# Patient Record
Sex: Female | Born: 2000 | Race: Black or African American | Hispanic: No | Marital: Single | Smoking: Never smoker
Health system: Southern US, Community
[De-identification: ages and names within clinical notes are randomized; demographics above are authoritative.]

## PROBLEM LIST (undated history)

## (undated) ENCOUNTER — Inpatient Hospital Stay (HOSPITAL_COMMUNITY): Payer: Self-pay

## (undated) DIAGNOSIS — Z8759 Personal history of other complications of pregnancy, childbirth and the puerperium: Secondary | ICD-10-CM

## (undated) DIAGNOSIS — D649 Anemia, unspecified: Secondary | ICD-10-CM

## (undated) DIAGNOSIS — K219 Gastro-esophageal reflux disease without esophagitis: Secondary | ICD-10-CM

## (undated) DIAGNOSIS — Z789 Other specified health status: Secondary | ICD-10-CM

## (undated) HISTORY — PX: COLON SURGERY: SHX602

---
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
## 2018-01-10 ENCOUNTER — Telehealth: Payer: Self-pay | Admitting: General Practice

## 2018-01-10 NOTE — Telephone Encounter (Signed)
Spoke with patient in regards to switching FP medicaid to pregnancy Medicaid.

## 2018-01-16 ENCOUNTER — Ambulatory Visit (INDEPENDENT_AMBULATORY_CARE_PROVIDER_SITE_OTHER): Payer: Medicaid Other | Admitting: Obstetrics and Gynecology

## 2018-01-16 ENCOUNTER — Other Ambulatory Visit: Payer: Self-pay

## 2018-01-16 VITALS — BP 97/60 | HR 78 | Wt 96.8 lb

## 2018-01-16 DIAGNOSIS — Z3400 Encounter for supervision of normal first pregnancy, unspecified trimester: Secondary | ICD-10-CM

## 2018-01-16 NOTE — Progress Notes (Signed)
   PRENATAL VISIT NOTE  Subjective:  Victoria Frye is a 17 y.o. G2P0010 at 5659w4d being seen today for ongoing prenatal care.  She is currently monitored for the following issues for this low-risk pregnancy and has Retained products of conception after miscarriage and Supervision of normal first pregnancy, antepartum on their problem list.  Patient reports decreased appetite and "unable to finish food once she is eating".  Contractions: Not present. Vag. Bleeding: None.  Movement: Absent. Denies leaking of fluid.   The following portions of the patient's history were reviewed and updated as appropriate: allergies, current medications, past family history, past medical history, past social history, past surgical history and problem list. Problem list updated.  Objective:   Vitals:   01/16/18 0950  BP: 97/60  Pulse: 78  Weight: 96 lb 12.8 oz (43.9 kg)    Fetal Status: Fetal Heart Rate (bpm): 155 Fundal Height: 16 cm Movement: Absent     General:  Alert, oriented and cooperative. Patient is in no acute distress.  Skin: Skin is warm and dry. No rash noted.   Cardiovascular: Normal heart rate noted  Respiratory: Normal respiratory effort, no problems with respiration noted  Abdomen: Soft, gravid, appropriate for gestational age.  Pain/Pressure: Absent     Pelvic: Cervical exam deferred        Extremities: Normal range of motion.  Edema: None  Mental Status: Normal mood and affect. Normal behavior. Normal judgment and thought content.   Assessment and Plan:  Pregnancy: G2P0010 at 859w4d  1. Supervision of normal first pregnancy, antepartum  - Advised to eat small frequent meals/snacks every 2-3 hrs - Recommend buying OTC Premier Protein Shake for meal/snack supplement - Information provided on high protein diet and protein rich foods   Preterm labor symptoms and general obstetric precautions including but not limited to vaginal bleeding, contractions, leaking of fluid and fetal  movement were reviewed in detail with the patient. Please refer to After Visit Summary for other counseling recommendations.  Return in about 4 weeks (around 02/13/2018) for Return OB visit.  Future Appointments  Date Time Provider Department Center  01/29/2018 10:15 AM [facility] US 4 [facility]  02/13/2018  9:50 AM Raelyn Moraawson, Luay Balding, CNM [facility] None    Raelyn Moraolitta Austen Wygant, CNM

## 2018-01-16 NOTE — Patient Instructions (Signed)
Protein Content in Foods Generally, most healthy people need around 50 grams of protein each day. Depending on your overall health, you may need more or less protein in your diet. Talk to your health care provider or dietitian about how much protein you need. See the following list for the protein content of some common foods. High-protein foods High-protein foods contain 4 grams (4 g) or more of protein per serving. They include:  Beef, ground sirloin (cooked) - 3 oz have 24 g of protein.  Cheese (hard) - 1 oz has 7 g of protein.  Chicken breast, boneless and skinless (cooked) - 3 oz have 13.4 g of protein.  Cottage cheese - 1/2 cup has 13.4 g of protein.  Egg - 1 egg has 6 g of protein.  Fish, filet (cooked) - 1 oz has 6-7 g of protein.  Garbanzo beans (canned or cooked) - 1/2 cup has 6-7 g of protein.  Kidney beans (canned or cooked) - 1/2 cup has 6-7 g of protein.  Lamb (cooked) - 3 oz has 24 g of protein.  Milk - 1 cup (8 oz) has 8 g of protein.  Nuts (peanuts, pistachios, almonds) - 1 oz has 6 g of protein.  Peanut butter - 1 oz has 7-8 g of protein.  Pork tenderloin (cooked) - 3 oz has 18.4 g of protein.  Pumpkin seeds - 1 oz has 8.5 g of protein.  Soybeans (roasted) - 1 oz has 8 g of protein.  Soybeans (cooked) - 1/2 cup has 11 g of protein.  Soy milk - 1 cup (8 oz) has 5-10 g of protein.  Soy or vegetable patty - 1 patty has 11 g of protein.  Sunflower seeds - 1 oz has 5.5 g of protein.  Tofu (firm) - 1/2 cup has 20 g of protein.  Tuna (canned in water) - 3 oz has 20 g of protein.  Yogurt - 6 oz has 8 g of protein.  Low-protein foods Low-protein foods contain 3 grams (3 g) or less of protein per serving. They include:  Beets (raw or cooked) - 1/2 cup has 1.5 g of protein.  Bran cereal - 1/2 cup has 2-3 g of protein.  Bread - 1 slice has 2.5 g of protein.  Broccoli (raw or cooked) - 1/2 cup has 2 g of protein.  Collard greens (raw or cooked) - 1/2  cup has 2 g of protein.  Corn (fresh or cooked) - 1/2 cup has 2 g of protein.  Cream cheese - 1 oz has 2 g of protein.  Creamer (half-and-half) - 1 oz has 1 g of protein.  Flour tortilla - 1 tortilla has 2.5 g of protein  Frozen yogurt - 1/2 cup has 3 g of protein.  Fruit or vegetable juice - 1/2 cup has 1 g of protein.  Green beans (raw or cooked) - 1/2 cup has 1 g of protein.  Green peas (canned) - 1/2 cup has 3.5 g of protein.  Muffins - 1 small muffin (2 oz) has 3 g of protein.  Oatmeal (cooked) - 1/2 cup has 3 g of protein.  Potato (baked with skin) - 1 medium potato has 3 g of protein.  Rice (cooked) - 1/2 cup has 2.5-3.5 g of protein.  Sour cream - 1/2 cup has 2.5 g of protein.  Spinach (cooked) - 1/2 cup has 3 g of protein.  Squash (cooked) - 1/2 cup has 1.5 g of protein.  Actual amounts of protein   may be different depending on processing. Talk with your health care provider or dietitian about what foods are recommended for you. This information is not intended to replace advice given to you by your health care provider. Make sure you discuss any questions you have with your health care provider. Document Released: 08/13/2015 Document Revised: 01/23/2016 Document Reviewed: 01/23/2016 Elsevier Interactive Patient Education  2018 Elsevier Inc.   High-Protein and High-Calorie Diet Eating high-protein and high-calorie foods can help you to gain weight, heal after an injury, and recover after an illness or surgery. What is my plan? The specific amount of daily protein and calories you need depends on:  Your body weight.  The reason this diet is recommended for you.  Generally, a high-protein, high-calorie diet involves:  Eating 250-500 extra calories each day.  Making sure that 10-35% of your daily calories come from protein.  Talk to your health care provider about how much protein and how many calories you need each day. Follow the diet as directed by your  health care provider. What do I need to know about this diet?  Ask your health care provider if you should take a nutritional supplement.  Try to eat six small meals each day instead of three large meals.  Eat a balanced diet, including one food that is high in protein at each meal.  Keep nutritious snacks handy, such as nuts, trail mixes, dried fruit, and yogurt.  If you have kidney disease or diabetes, eating too much protein may put extra stress on your kidneys. Talk to your health care provider if you have either of those conditions. What are some high-protein foods? Grains Quinoa. Bulgur wheat. Vegetables Soybeans. Peas. Meats and Other Protein Sources Beef, pork, and poultry. Fish and seafood. Eggs. Tofu. Textured vegetable protein (TVP). Peanut butter. Nuts and seeds. Dried beans. Protein powders. Dairy Whole milk. Whole-milk yogurt. Powdered milk. Cheese. Cottage Cheese. Eggnog. Beverages High-protein supplement drinks. Soy milk. Other Protein bars. The items listed above may not be a complete list of recommended foods or beverages. Contact your dietitian for more options. What are some high-calorie foods? Grains Pasta. Quick breads. Muffins. Pancakes. Ready-to-eat cereal. Vegetables Vegetables cooked in oil or butter. Fried potatoes. Fruits Dried fruit. Fruit leather. Canned fruit in syrup. Fruit juice. Avocados. Meats and Other Protein Sources Peanut butter. Nuts and seeds. Dairy Heavy cream. Whipped cream. Cream cheese. Sour cream. Ice cream. Custard. Pudding. Beverages Meal-replacement beverages. Nutrition shakes. Fruit juice. Sugar-sweetened soft drinks. Condiments Salad dressing. Mayonnaise. Alfredo sauce. Fruit preserves or jelly. Honey. Syrup. Sweets/Desserts Cake. Cookies. Pie. Pastries. Candy bars. Chocolate. Fats and Oils Butter or margarine. Oil. Gravy. Other Meal-replacement bars. The items listed above may not be a complete list of recommended  foods or beverages. Contact your dietitian for more options. What are some tips for including high-protein and high-calorie foods in my diet?  Add whole milk, half-and-half, or heavy cream to cereal, pudding, soup, or hot cocoa.  Add whole milk to instant breakfast drinks.  Add peanut butter to oatmeal or smoothies.  Add powdered milk to baked goods, smoothies, or milkshakes.  Add powdered milk, cream, or butter to mashed potatoes.  Add cheese to cooked vegetables.  Make whole-milk yogurt parfaits. Top them with granola, fruit, or nuts.  Add cottage cheese to your fruit.  Add avocados, cheese, or both to sandwiches or salads.  Add meat, poultry, or seafood to rice, pasta, casseroles, salads, and soups.  Use mayonnaise when making egg salad, chicken salad, or   tuna salad.  Use peanut butter as a topping for pretzels, celery, or crackers.  Add beans to casseroles, dips, and spreads.  Add pureed beans to sauces and soups.  Replace calorie-free drinks with calorie-containing drinks, such as milk and fruit juice. This information is not intended to replace advice given to you by your health care provider. Make sure you discuss any questions you have with your health care provider. Document Released: 05/14/2005 Document Revised: 10/20/2015 Document Reviewed: 10/27/2013 Elsevier Interactive Patient Education  2018 Elsevier Inc.  

## 2018-01-22 ENCOUNTER — Encounter (HOSPITAL_COMMUNITY): Payer: Self-pay

## 2018-01-29 ENCOUNTER — Ambulatory Visit (HOSPITAL_COMMUNITY)
Admission: RE | Admit: 2018-01-29 | Discharge: 2018-01-29 | Disposition: A | Discharge status: Home / Self Care | Payer: Medicaid Other | Source: Ambulatory Visit | Attending: Obstetrics and Gynecology | Admitting: Obstetrics and Gynecology

## 2018-01-29 DIAGNOSIS — Z3402 Encounter for supervision of normal first pregnancy, second trimester: Secondary | ICD-10-CM | POA: Diagnosis not present

## 2018-01-29 DIAGNOSIS — Z363 Encounter for antenatal screening for malformations: Secondary | ICD-10-CM | POA: Diagnosis not present

## 2018-01-29 DIAGNOSIS — Z3A18 18 weeks gestation of pregnancy: Secondary | ICD-10-CM | POA: Diagnosis not present

## 2018-01-29 DIAGNOSIS — Z3400 Encounter for supervision of normal first pregnancy, unspecified trimester: Secondary | ICD-10-CM

## 2018-02-13 ENCOUNTER — Ambulatory Visit (INDEPENDENT_AMBULATORY_CARE_PROVIDER_SITE_OTHER): Payer: Medicaid Other | Admitting: Obstetrics and Gynecology

## 2018-02-13 DIAGNOSIS — Z3400 Encounter for supervision of normal first pregnancy, unspecified trimester: Secondary | ICD-10-CM

## 2018-02-13 NOTE — Progress Notes (Signed)
   PRENATAL VISIT NOTE  Subjective:  Victoria Frye is a 17 y.o. G2P0010 at 1063w4d being seen today for ongoing prenatal care.  She is currently monitored for the following issues for this low-risk pregnancy and has Retained products of conception after miscarriage and Supervision of normal first pregnancy, antepartum on their problem list.  Patient reports no complaints.  Contractions: Not present. Vag. Bleeding: None.  Movement: Present. Denies leaking of fluid.   The following portions of the patient's history were reviewed and updated as appropriate: allergies, current medications, past family history, past medical history, past social history, past surgical history and problem list. Problem list updated.  Objective:   Vitals:   02/13/18 1001  BP: 100/65  Pulse: 99  Weight: 98 lb 4.8 oz (44.6 kg)    Fetal Status: Fetal Heart Rate (bpm): 150 Fundal Height: 20 cm Movement: Present     General:  Alert, oriented and cooperative. Patient is in no acute distress.  Skin: Skin is warm and dry. No rash noted.   Cardiovascular: Normal heart rate noted  Respiratory: Normal respiratory effort, no problems with respiration noted  Abdomen: Soft, gravid, appropriate for gestational age.  Pain/Pressure: Absent     Pelvic: Cervical exam deferred        Extremities: Normal range of motion.  Edema: None  Mental Status: Normal mood and affect. Normal behavior. Normal judgment and thought content.   Assessment and Plan:  Pregnancy: G2P0010 at 7663w4d  1. Supervision of normal first pregnancy, antepartum - Discussed normal anatomy U/S  Preterm labor symptoms and general obstetric precautions including but not limited to vaginal bleeding, contractions, leaking of fluid and fetal movement were reviewed in detail with the patient. Please refer to After Visit Summary for other counseling recommendations.  Return in about 4 weeks (around 03/13/2018) for Return OB visit.  Future Appointments  Date Time  Provider Department Center  03/13/2018 10:10 AM Raelyn Moraawson, Temprence Rhines, CNM [facility] None  04/10/2018  [facility] None  04/10/2018  8:50 AM Raelyn Moraawson, Illianna Paschal, CNM [facility] None    Raelyn Moraolitta Anitra Doxtater, CNM

## 2018-03-08 IMAGING — US US OB < 14 WEEKS - US OB TV
1 series · 14 of 28 positions shown · non-contrast
Comparison: None.

CLINICAL DATA: Pregnant patient with lower abdominal cramping.

EXAM:
OBSTETRIC <14 WK US AND TRANSVAGINAL OB US
TECHNIQUE: Both transabdominal and transvaginal ultrasound examinations were
performed for complete evaluation of the gestation as well as the
maternal uterus, adnexal regions, and pelvic cul-de-sac.
Transvaginal technique was performed to assess early pregnancy.

[Series 1: us ob < 14 weeks - us ob tv · 0.17mm/px · 14 of 55 slices shown]
[im 3/55]
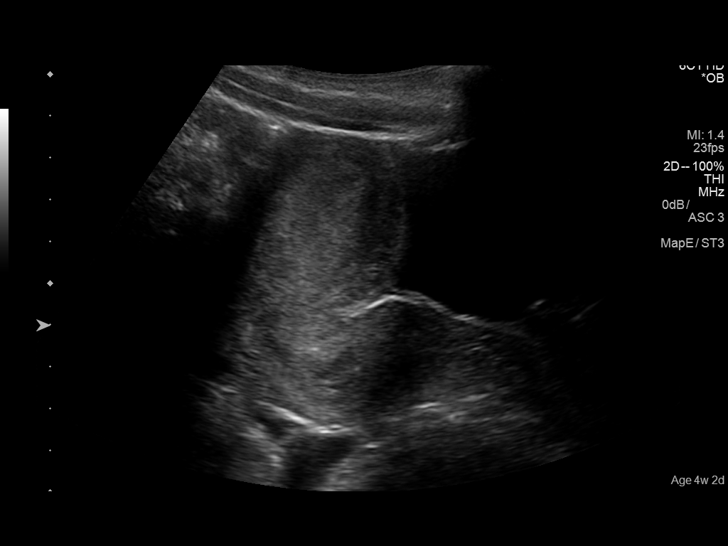
[im 7/55]
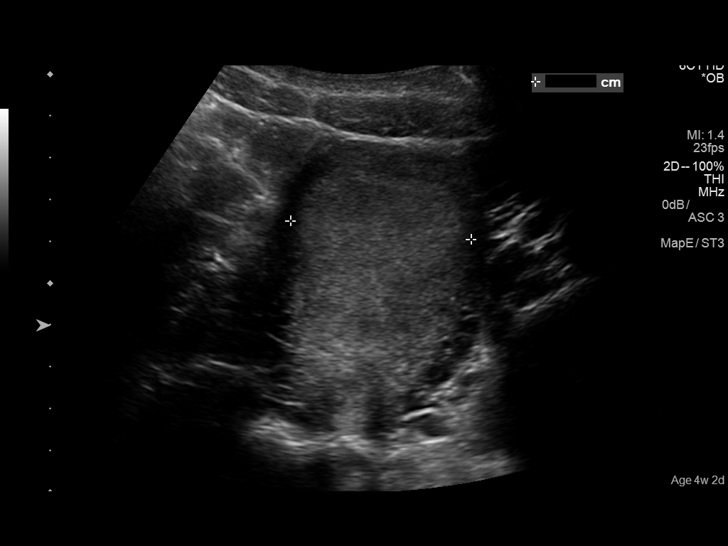
[im 11/55]
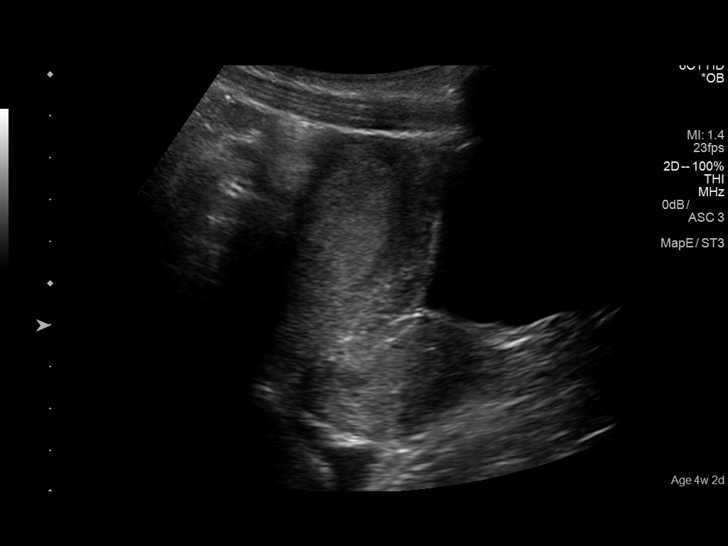
[im 15/55]
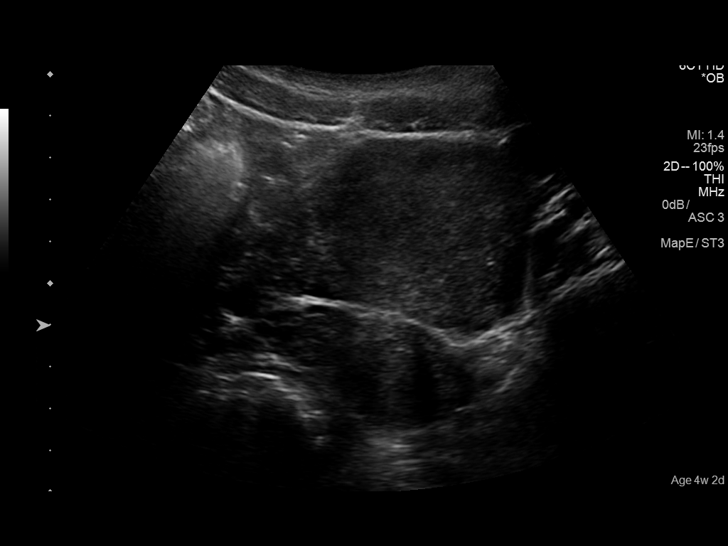
[im 19/55]
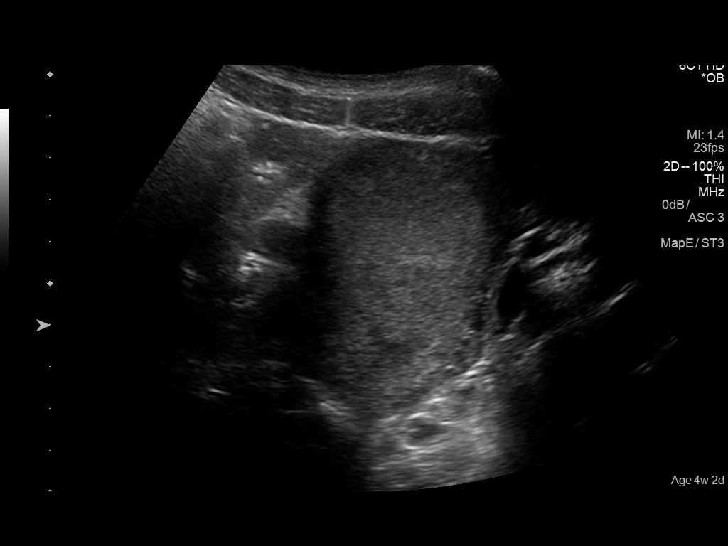
[im 23/55]
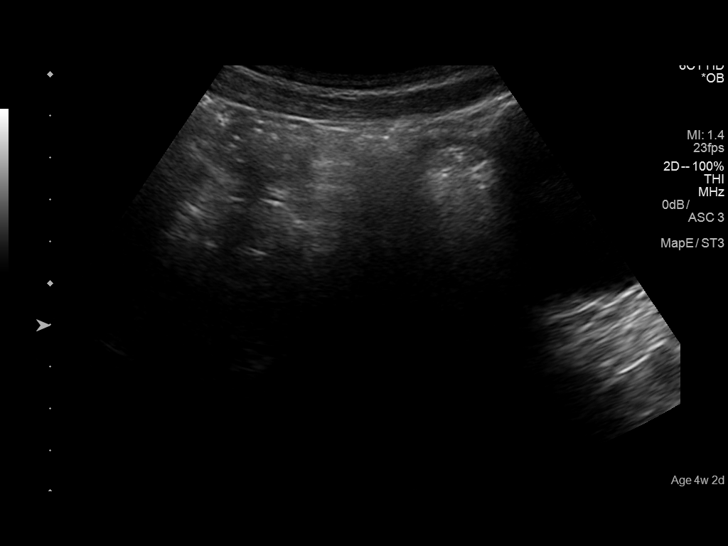
[im 27/55]
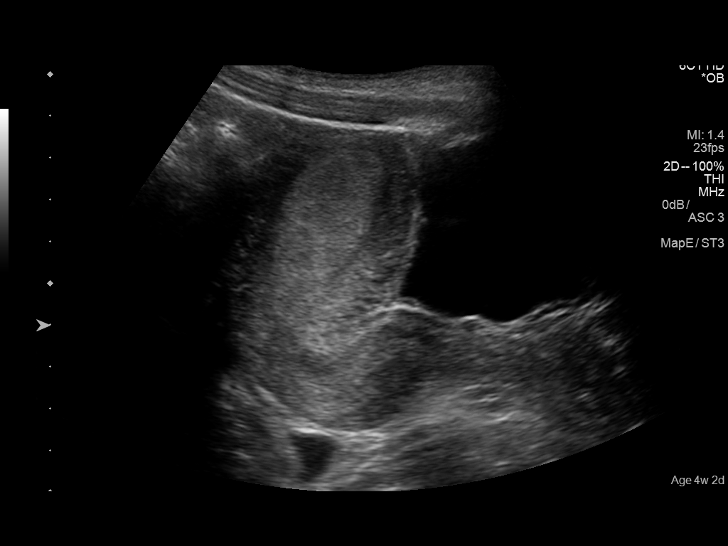
[im 31/55]
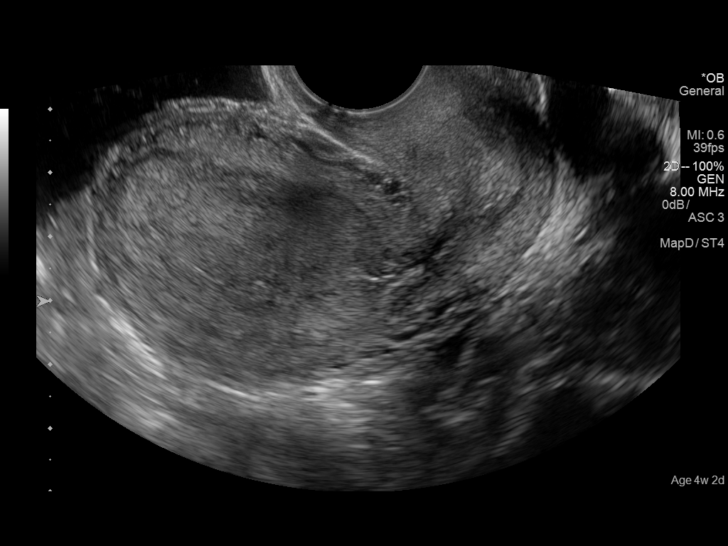
[im 35/55]
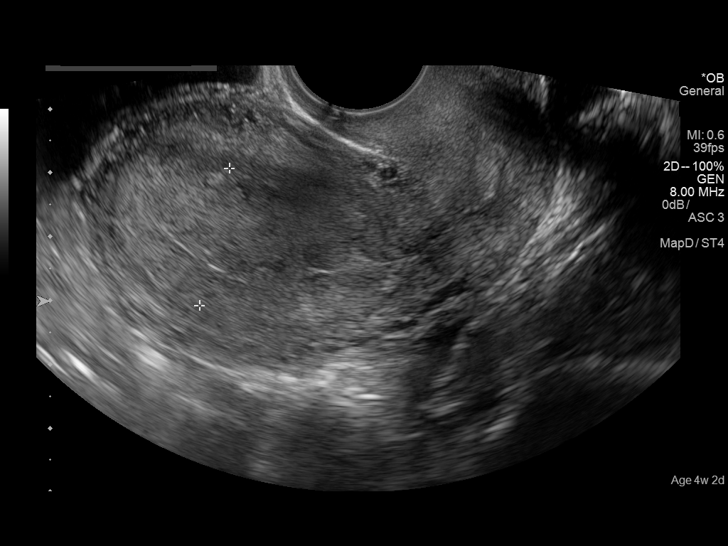
[im 39/55]
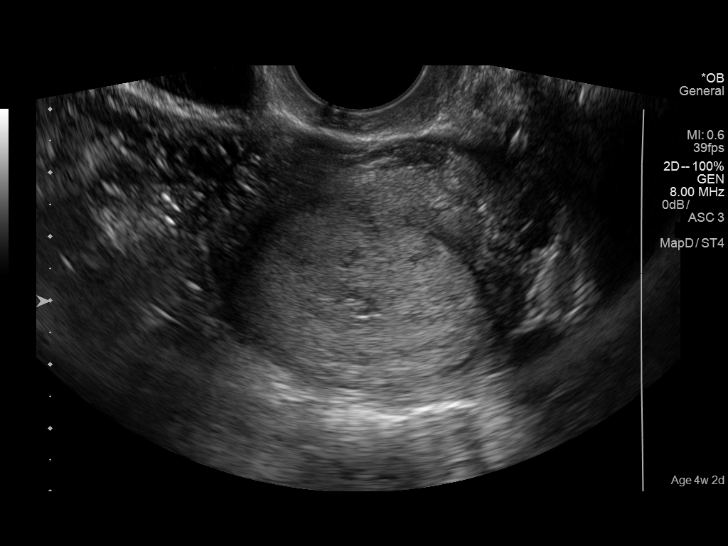
[im 43/55]
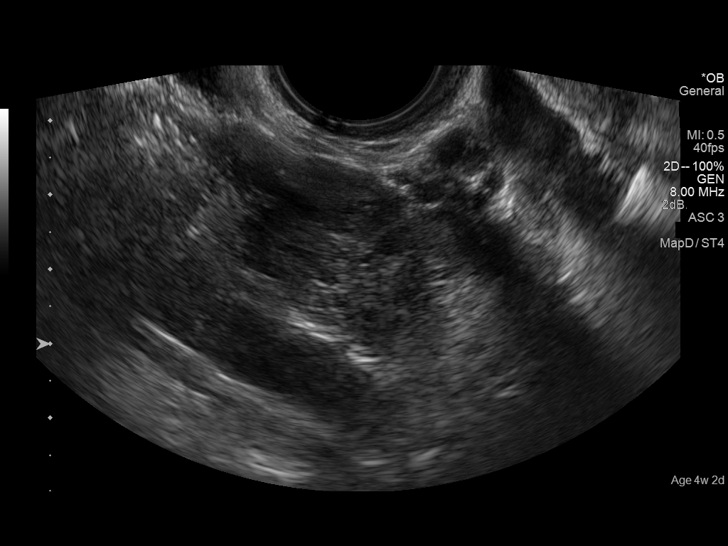
[im 47/55]
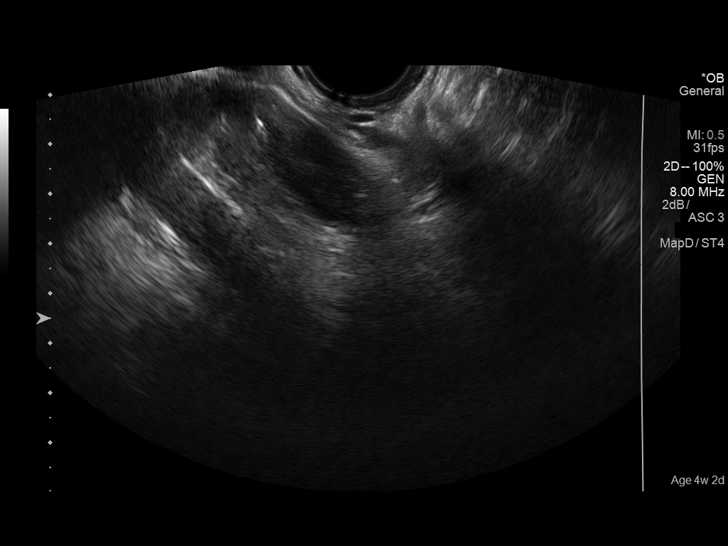
[im 51/55]
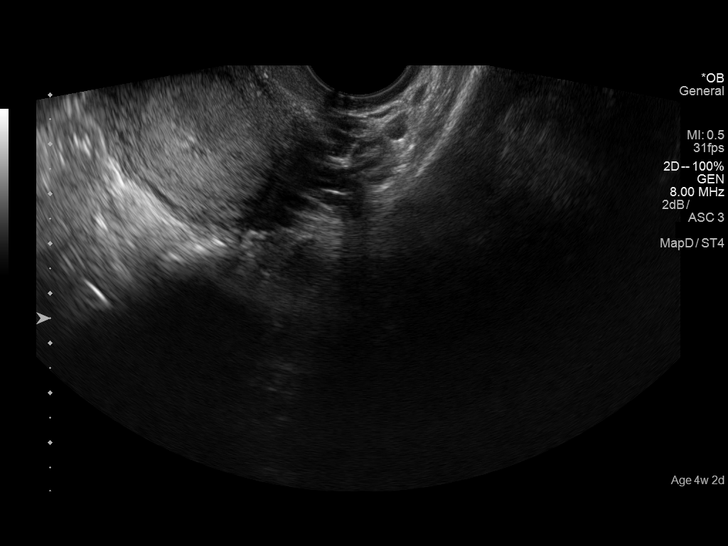
[im 55/55]
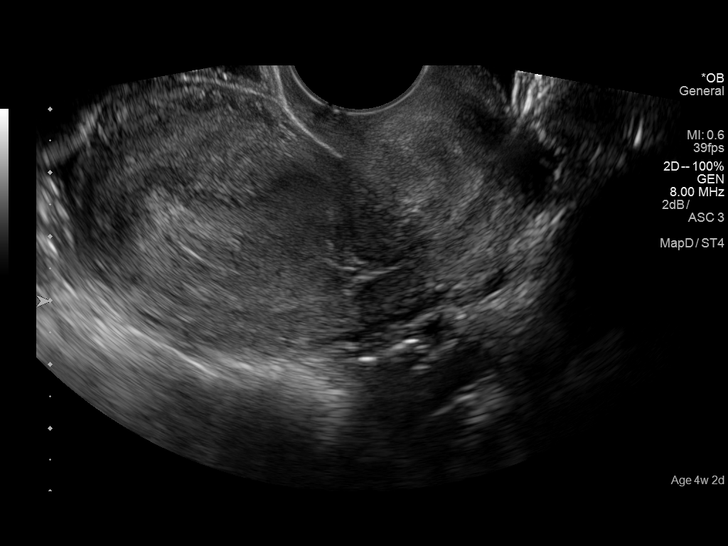

[14 of 28 positions shown; findings below may reference images not displayed]

FINDINGS: Intrauterine gestational sac: None

Yolk sac:  Not Visualized.

Embryo:  Not Visualized.

Cardiac Activity: Not Visualized.

Subchorionic hemorrhage:  None visualized.

Maternal uterus/adnexae: Normal right ovary. Left ovary not
visualized. No adnexal masses identified. Thickened endometrium.
IMPRESSION: No intrauterine gestation identified. In the setting of positive
pregnancy test and no definite intrauterine pregnancy, this reflects
a pregnancy of unknown location. Differential considerations include
early normal IUP, abnormal IUP, or nonvisualized ectopic pregnancy.
Differentiation is achieved with serial beta HCG supplemented by
repeat sonography as clinically warranted.

## 2018-03-13 ENCOUNTER — Ambulatory Visit (INDEPENDENT_AMBULATORY_CARE_PROVIDER_SITE_OTHER): Payer: 59 | Admitting: Obstetrics and Gynecology

## 2018-03-13 VITALS — BP 99/63 | HR 106 | Wt 101.4 lb

## 2018-03-13 DIAGNOSIS — Z3400 Encounter for supervision of normal first pregnancy, unspecified trimester: Secondary | ICD-10-CM

## 2018-03-13 NOTE — Progress Notes (Signed)
   PRENATAL VISIT NOTE  Subjective:  Victoria Frye is a 17 y.o. G2P0010 at [redacted]w[redacted]d being seen today for ongoing prenatal care.  She is currently monitored for the following issues for this low-risk pregnancy and has Retained products of conception after miscarriage and Supervision of normal first pregnancy, antepartum on their problem list.  Patient reports no complaints.  Contractions: Not present. Vag. Bleeding: None.  Movement: Present. Denies leaking of fluid.   The following portions of the patient's history were reviewed and updated as appropriate: allergies, current medications, past family history, past medical history, past social history, past surgical history and problem list. Problem list updated.  Objective:   Vitals:   03/13/18 1027  BP: 99/63  Pulse: (!) 106  Weight: 101 lb 6.4 oz (46 kg)    Fetal Status: Fetal Heart Rate (bpm): 145 Fundal Height: 22 cm Movement: Present     General:  Alert, oriented and cooperative. Patient is in no acute distress.  Skin: Skin is warm and dry. No rash noted.   Cardiovascular: Normal heart rate noted  Respiratory: Normal respiratory effort, no problems with respiration noted  Abdomen: Soft, gravid, appropriate for gestational age.  Pain/Pressure: Absent     Pelvic: Cervical exam deferred        Extremities: Normal range of motion.  Edema: None  Mental Status: Normal mood and affect. Normal behavior. Normal judgment and thought content.   Assessment and Plan:  Pregnancy: G2P0010 at [redacted]w[redacted]d  1. Supervision of normal first pregnancy, antepartum - Anticipatory guidance for 2 hr GTT nv and every 2 weeks  Preterm labor symptoms and general obstetric precautions including but not limited to vaginal bleeding, contractions, leaking of fluid and fetal movement were reviewed in detail with the patient. Please refer to After Visit Summary for other counseling recommendations.  Return in about 4 weeks (around 04/10/2018) for Return OB 2hr  GTT.  Future Appointments  Date Time Provider Department Center  04/10/2018  8:30 AM [facility] None  04/10/2018  8:50 AM [name], CNM [facility] None    [name], CNM

## 2018-03-13 NOTE — Patient Instructions (Signed)

## 2018-03-13 NOTE — Progress Notes (Signed)
Subjective: Victoria Frye is a G2P0010 at [redacted]w[redacted]d who presents to the [facility] today for ob visit.  She does not have a history of any mental health concerns. She is not currently sexually active. She is currently using no method for birth control. She has had recent STD screening on 11/15/2017 and was tested for GC/Chlamydia. Patient states family as her support system.  Patient reports she currently works at [employer].   BP 99/63   Pulse (!) 106   Wt 101 lb 6.4 oz (46 kg)   LMP 09/24/2017   BMI 18.55 kg/m   Birth Control History:  No reported history  MDM Patient counseled on all options for birth control today including LARC. Patient desires PP IUD/Nexplanon initiated for birth control.   Assessment:  17 y.o. female considering PP IUD/ Nexplanon for birth control.  Plan: [facility] appt schedule 10/22 at 1pm  Gwyndolyn Saxon, [place] 03/13/2018 4:04 PM

## 2018-03-18 DIAGNOSIS — Z1388 Encounter for screening for disorder due to exposure to contaminants: Secondary | ICD-10-CM | POA: Diagnosis not present

## 2018-03-18 DIAGNOSIS — Z3009 Encounter for other general counseling and advice on contraception: Secondary | ICD-10-CM | POA: Diagnosis not present

## 2018-03-18 DIAGNOSIS — Z0389 Encounter for observation for other suspected diseases and conditions ruled out: Secondary | ICD-10-CM | POA: Diagnosis not present

## 2018-04-10 ENCOUNTER — Other Ambulatory Visit: Payer: 59

## 2018-04-10 ENCOUNTER — Encounter: Payer: Self-pay | Admitting: General Practice

## 2018-04-10 ENCOUNTER — Ambulatory Visit (INDEPENDENT_AMBULATORY_CARE_PROVIDER_SITE_OTHER): Payer: 59 | Admitting: Obstetrics and Gynecology

## 2018-04-10 VITALS — BP 102/67 | HR 106 | Wt 107.6 lb

## 2018-04-10 DIAGNOSIS — Z23 Encounter for immunization: Secondary | ICD-10-CM

## 2018-04-10 DIAGNOSIS — Z3403 Encounter for supervision of normal first pregnancy, third trimester: Secondary | ICD-10-CM | POA: Diagnosis not present

## 2018-04-10 DIAGNOSIS — Z3400 Encounter for supervision of normal first pregnancy, unspecified trimester: Secondary | ICD-10-CM

## 2018-04-10 MED ORDER — TETANUS-DIPHTH-ACELL PERTUSSIS 5-2.5-18.5 LF-MCG/0.5 IM SUSP: 0.5000 mL | Freq: Once | INTRAMUSCULAR | Status: DC

## 2018-04-10 NOTE — Patient Instructions (Addendum)
Third Trimester of Pregnancy The third trimester is from week 29 through week 42, months 7 through 9. This trimester is when your unborn baby (fetus) is growing very fast. At the end of the ninth month, the unborn baby is about 20 inches in length. It weighs about 6-10 pounds. Follow these instructions at home:  Avoid all smoking, herbs, and alcohol. Avoid drugs not approved by your doctor.  Do not use any tobacco products, including cigarettes, chewing tobacco, and electronic cigarettes. If you need help quitting, ask your doctor. You may get counseling or other support to help you quit.  Only take medicine as told by your doctor. Some medicines are safe and some are not during pregnancy.  Exercise only as told by your doctor. Stop exercising if you start having cramps.  Eat regular, healthy meals.  Wear a good support bra if your breasts are tender.  Do not use hot tubs, steam rooms, or saunas.  Wear your seat belt when driving.  Avoid raw meat, uncooked cheese, and liter boxes and soil used by cats.  Take your prenatal vitamins.  Take 1500-2000 milligrams of calcium daily starting at the 20th week of pregnancy until you deliver your baby.  Try taking medicine that helps you poop (stool softener) as needed, and if your doctor approves. Eat more fiber by eating fresh fruit, vegetables, and whole grains. Drink enough fluids to keep your pee (urine) clear or pale yellow.  Take warm water baths (sitz baths) to soothe pain or discomfort caused by hemorrhoids. Use hemorrhoid cream if your doctor approves.  If you have puffy, bulging veins (varicose veins), wear support hose. Raise (elevate) your feet for 15 minutes, 3-4 times a day. Limit salt in your diet.  Avoid heavy lifting, wear low heels, and sit up straight.  Rest with your legs raised if you have leg cramps or low back pain.  Visit your dentist if you have not gone during your pregnancy. Use a soft toothbrush to brush your  teeth. Be gentle when you floss.  You can have sex (intercourse) unless your doctor tells you not to.  Do not travel far distances unless you must. Only do so with your doctor's approval.  Take prenatal classes.  Practice driving to the hospital.  Pack your hospital bag.  Prepare the baby's room.  Go to your doctor visits. Get help if:  You are not sure if you are in labor or if your water has broken.  You are dizzy.  You have mild cramps or pressure in your lower belly (abdominal).  You have a nagging pain in your belly area.  You continue to feel sick to your stomach (nauseous), throw up (vomit), or have watery poop (diarrhea).  You have bad smelling fluid coming from your vagina.  You have pain with peeing (urination). Get help right away if:  You have a fever.  You are leaking fluid from your vagina.  You are spotting or bleeding from your vagina.  You have severe belly cramping or pain.  You lose or gain weight rapidly.  You have trouble catching your breath and have chest pain.  You notice sudden or extreme puffiness (swelling) of your face, hands, ankles, feet, or legs.  You have not felt the baby move in over an hour.  You have severe headaches that do not go away with medicine.  You have vision changes. This information is not intended to replace advice given to you by your health care provider. Make   sure you discuss any questions you have with your health care provider. Document Released: 08/08/2009 Document Revised: 10/20/2015 Document Reviewed: 07/15/2012 Elsevier Interactive Patient Education  2017 Elsevier Inc.  Tdap Vaccine (Tetanus, Diphtheria and Pertussis): What You Need to Know 1. Why get vaccinated? Tetanus, diphtheria and pertussis are very serious diseases. Tdap vaccine can protect us from these diseases. And, Tdap vaccine given to pregnant women can protect newborn babies against pertussis. TETANUS (Lockjaw) is rare in the [place] today. It causes painful muscle tightening and stiffness, usually all over the body.  It can lead to tightening of muscles in the head and neck so you can't open your mouth, swallow, or sometimes even breathe. Tetanus kills about 1 out of 10 people who are infected even after receiving the best medical care.  DIPHTHERIA is also rare in the [place] today. It can cause a thick coating to form in the back of the throat.  It can lead to breathing problems, heart failure, paralysis, and death.  PERTUSSIS (Whooping Cough) causes severe coughing spells, which can cause difficulty breathing, vomiting and disturbed sleep.  It can also lead to weight loss, incontinence, and rib fractures. Up to 2 in 100 adolescents and 5 in 100 adults with pertussis are hospitalized or have complications, which could include pneumonia or death.  These diseases are caused by bacteria. Diphtheria and pertussis are spread from person to person through secretions from coughing or sneezing. Tetanus enters the body through cuts, scratches, or wounds. Before vaccines, as many as 200,000 cases of diphtheria, 200,000 cases of pertussis, and hundreds of cases of tetanus, were reported in the [place] each year. Since vaccination began, reports of cases for tetanus and diphtheria have dropped by about 99% and for pertussis by about 80%. 2. Tdap vaccine Tdap vaccine can protect adolescents and adults from tetanus, diphtheria, and pertussis. One dose of Tdap is routinely given at age 11 or 12. People who did not get Tdap at that age should get it as soon as possible. Tdap is especially important for healthcare professionals and anyone having close contact with a baby younger than 12 months. Pregnant women should get a dose of Tdap during every pregnancy, to protect the newborn from pertussis. Infants are most at risk for severe, life-threatening complications from pertussis. Another vaccine, called Td, protects against  tetanus and diphtheria, but not pertussis. A Td booster should be given every 10 years. Tdap may be given as one of these boosters if you have never gotten Tdap before. Tdap may also be given after a severe cut or burn to prevent tetanus infection. Your doctor or the person giving you the vaccine can give you more information. Tdap may safely be given at the same time as other vaccines. 3. Some people should not get this vaccine  A person who has ever had a life-threatening allergic reaction after a previous dose of any diphtheria, tetanus or pertussis containing vaccine, OR has a severe allergy to any part of this vaccine, should not get Tdap vaccine. Tell the person giving the vaccine about any severe allergies.  Anyone who had coma or long repeated seizures within 7 days after a childhood dose of DTP or DTaP, or a previous dose of Tdap, should not get Tdap, unless a cause other than the vaccine was found. They can still get Td.  Talk to your doctor if you: ? have seizures or another nervous system problem, ? had severe pain or swelling after any vaccine   containing diphtheria, tetanus or pertussis, ? ever had a condition called Guillain-Barr Syndrome (GBS), ? aren't feeling well on the day the shot is scheduled. 4. Risks With any medicine, including vaccines, there is a chance of side effects. These are usually mild and go away on their own. Serious reactions are also possible but are rare. Most people who get Tdap vaccine do not have any problems with it. Mild problems following Tdap: (Did not interfere with activities)  Pain where the shot was given (about 3 in 4 adolescents or 2 in 3 adults)  Redness or swelling where the shot was given (about 1 person in 5)  Mild fever of at least 100.4F (up to about 1 in 25 adolescents or 1 in 100 adults)  Headache (about 3 or 4 people in 10)  Tiredness (about 1 person in 3 or 4)  Nausea, vomiting, diarrhea, stomach ache (up to 1 in 4  adolescents or 1 in 10 adults)  Chills, sore joints (about 1 person in 10)  Body aches (about 1 person in 3 or 4)  Rash, swollen glands (uncommon)  Moderate problems following Tdap: (Interfered with activities, but did not require medical attention)  Pain where the shot was given (up to 1 in 5 or 6)  Redness or swelling where the shot was given (up to about 1 in 16 adolescents or 1 in 12 adults)  Fever over 102F (about 1 in 100 adolescents or 1 in 250 adults)  Headache (about 1 in 7 adolescents or 1 in 10 adults)  Nausea, vomiting, diarrhea, stomach ache (up to 1 or 3 people in 100)  Swelling of the entire arm where the shot was given (up to about 1 in 500).  Severe problems following Tdap: (Unable to perform usual activities; required medical attention)  Swelling, severe pain, bleeding and redness in the arm where the shot was given (rare).  Problems that could happen after any vaccine:  People sometimes faint after a medical procedure, including vaccination. Sitting or lying down for about 15 minutes can help prevent fainting, and injuries caused by a fall. Tell your doctor if you feel dizzy, or have vision changes or ringing in the ears.  Some people get severe pain in the shoulder and have difficulty moving the arm where a shot was given. This happens very rarely.  Any medication can cause a severe allergic reaction. Such reactions from a vaccine are very rare, estimated at fewer than 1 in a million doses, and would happen within a few minutes to a few hours after the vaccination. As with any medicine, there is a very remote chance of a vaccine causing a serious injury or death. The safety of vaccines is always being monitored. For more information, visit: www.cdc.gov/vaccinesafety/ 5. What if there is a serious problem? What should I look for? Look for anything that concerns you, such as signs of a severe allergic reaction, very high fever, or unusual behavior. Signs of  a severe allergic reaction can include hives, swelling of the face and throat, difficulty breathing, a fast heartbeat, dizziness, and weakness. These would usually start a few minutes to a few hours after the vaccination. What should I do?  If you think it is a severe allergic reaction or other emergency that can't wait, call 9-1-1 or get the person to the nearest hospital. Otherwise, call your doctor.  Afterward, the reaction should be reported to the Vaccine Adverse Event Reporting System (VAERS). Your doctor might file this report, or you   can do it yourself through the VAERS web site at www.vaers.hhs.gov, or by calling 1-800-822-7967. ? VAERS does not give medical advice. 6. The National Vaccine Injury Compensation Program The National Vaccine Injury Compensation Program (VICP) is a federal program that was created to compensate people who may have been injured by certain vaccines. Persons who believe they may have been injured by a vaccine can learn about the program and about filing a claim by calling 1-800-338-2382 or visiting the VICP website at www.hrsa.gov/vaccinecompensation. There is a time limit to file a claim for compensation. 7. How can I learn more?  Ask your doctor. He or she can give you the vaccine package insert or suggest other sources of information.  Call your local or state health department.  Contact the Centers for Disease Control and Prevention (CDC): ? Call 1-800-232-4636 (1-800-CDC-INFO) or ? Visit CDC's website at www.cdc.gov/vaccines CDC Tdap Vaccine VIS (07/21/13) This information is not intended to replace advice given to you by your health care provider. Make sure you discuss any questions you have with your health care provider. Document Released: 11/13/2011 Document Revised: 02/02/2016 Document Reviewed: 02/02/2016 Elsevier Interactive Patient Education  2017 Elsevier Inc.  

## 2018-04-10 NOTE — Progress Notes (Signed)
   PRENATAL VISIT NOTE  Subjective:  Victoria Frye is a 17 y.o. G2P0010 at 5239w4d being seen today for ongoing prenatal care.  She is currently monitored for the following issues for this low-risk pregnancy and has Retained products of conception after miscarriage and Supervision of normal first pregnancy, antepartum on their problem list.  Patient reports no complaints.  Contractions: Not present. Vag. Bleeding: None.  Movement: Present. Denies leaking of fluid.   The following portions of the patient's history were reviewed and updated as appropriate: allergies, current medications, past family history, past medical history, past social history, past surgical history and problem list. Problem list updated.  Objective:   Vitals:   04/10/18 0852  BP: 102/67  Pulse: (!) 106  Weight: 107 lb 9.6 oz (48.8 kg)    Fetal Status: Fetal Heart Rate (bpm): 142 Fundal Height: 26 cm Movement: Present     General:  Alert, oriented and cooperative. Patient is in no acute distress.  Skin: Skin is warm and dry. No rash noted.   Cardiovascular: Normal heart rate noted  Respiratory: Normal respiratory effort, no problems with respiration noted  Abdomen: Soft, gravid, appropriate for gestational age.  Pain/Pressure: Absent     Pelvic: Cervical exam deferred        Extremities: Normal range of motion.  Edema: None  Mental Status: Normal mood and affect. Normal behavior. Normal judgment and thought content.   Assessment and Plan:  Pregnancy: G2P0010 at 5739w4d  1. Supervision of normal first pregnancy, antepartum - Tdap vaccine greater than or equal to 7yo IM - Anticipatory guidance for OB visits every 2 wks until 36 wks; which will start weekly visits  Preterm labor symptoms and general obstetric precautions including but not limited to vaginal bleeding, contractions, leaking of fluid and fetal movement were reviewed in detail with the patient. Please refer to After Visit Summary for other counseling  recommendations.  Return in about 2 weeks (around 04/24/2018) for Return OB visit.  Raelyn Moraolitta Maylea Soria, CNM

## 2018-04-10 NOTE — Progress Notes (Signed)
Patient is here for lab work only

## 2018-04-11 ENCOUNTER — Telehealth: Payer: Self-pay | Admitting: *Deleted

## 2018-04-11 ENCOUNTER — Encounter: Payer: Self-pay | Admitting: Obstetrics and Gynecology

## 2018-04-11 DIAGNOSIS — O99013 Anemia complicating pregnancy, third trimester: Secondary | ICD-10-CM | POA: Insufficient documentation

## 2018-04-11 HISTORY — DX: Anemia complicating pregnancy, third trimester: O99.013

## 2018-04-11 LAB — CBC
Hematocrit: 26.4 % — ABNORMAL LOW (ref 34.0–46.6)
Hemoglobin: 8.9 g/dL — ABNORMAL LOW (ref 11.1–15.9)
MCH: 27 pg (ref 26.6–33.0)
MCHC: 33.7 g/dL (ref 31.5–35.7)
MCV: 80 fL (ref 79–97)
PLATELETS: 266 10*3/uL (ref 150–450)
RBC: 3.3 x10E6/uL — ABNORMAL LOW (ref 3.77–5.28)
RDW: 13.5 % (ref 12.3–15.4)
WBC: 8.4 10*3/uL (ref 3.4–10.8)

## 2018-04-11 LAB — GLUCOSE TOLERANCE, 2 HOURS W/ 1HR
GLUCOSE, 1 HOUR: 120 mg/dL (ref 65–179)
GLUCOSE, 2 HOUR: 103 mg/dL (ref 65–152)
GLUCOSE, FASTING: 79 mg/dL (ref 65–91)

## 2018-04-11 LAB — HIV ANTIBODY (ROUTINE TESTING W REFLEX): HIV SCREEN 4TH GENERATION: NONREACTIVE

## 2018-04-11 LAB — RPR: RPR: NONREACTIVE

## 2018-04-11 NOTE — Telephone Encounter (Signed)
Left voice message for patient to return nurse call Monday 04/14/18.  Clovis PuMartin, Chanse Kagel L, RN

## 2018-04-11 NOTE — Telephone Encounter (Signed)
-----   Message from Raelyn Moraolitta Dawson, [place] sent at 04/11/2018  7:46 AM EST ----- She needs a feraheme infusion and iron supplements twice daily.

## 2018-04-11 NOTE — Telephone Encounter (Signed)
Tried to call patient regarding low iron level. Will send MyChart message.  Clovis PuMartin, Tamika L, RN

## 2018-04-14 MED ORDER — FERROUS SULFATE 325 (65 FE) MG PO TABS: ORAL_TABLET | ORAL | 3 refills | Status: DC

## 2018-04-14 NOTE — Addendum Note (Signed)
Addended by: Clovis PuMARTIN, TAMIKA L on: 04/14/2018 12:50 PM   Modules accepted: Orders, SmartSet

## 2018-04-21 ENCOUNTER — Ambulatory Visit (HOSPITAL_COMMUNITY)
Admission: RE | Admit: 2018-04-21 | Discharge: 2018-04-21 | Disposition: A | Discharge status: Home / Self Care | Payer: 59 | Source: Ambulatory Visit | Attending: Obstetrics and Gynecology | Admitting: Obstetrics and Gynecology

## 2018-04-21 DIAGNOSIS — D509 Iron deficiency anemia, unspecified: Secondary | ICD-10-CM | POA: Diagnosis not present

## 2018-04-21 MED ORDER — SODIUM CHLORIDE 0.9 % IV SOLN
510.0000 mg | Freq: Once | INTRAVENOUS | Status: AC
  Administered 2018-04-21: 510 mg via INTRAVENOUS
  Filled 2018-04-21: qty 510

## 2018-04-21 NOTE — Discharge Instructions (Signed)

## 2018-04-23 ENCOUNTER — Ambulatory Visit (INDEPENDENT_AMBULATORY_CARE_PROVIDER_SITE_OTHER): Payer: 59 | Admitting: Nurse Practitioner

## 2018-04-23 VITALS — BP 98/58 | HR 85 | Wt 107.6 lb

## 2018-04-23 DIAGNOSIS — Z3403 Encounter for supervision of normal first pregnancy, third trimester: Secondary | ICD-10-CM

## 2018-04-23 DIAGNOSIS — Z3400 Encounter for supervision of normal first pregnancy, unspecified trimester: Secondary | ICD-10-CM

## 2018-04-23 DIAGNOSIS — Z3A30 30 weeks gestation of pregnancy: Secondary | ICD-10-CM

## 2018-04-23 DIAGNOSIS — O99013 Anemia complicating pregnancy, third trimester: Secondary | ICD-10-CM

## 2018-04-23 MED ORDER — DOCUSATE CALCIUM 240 MG PO CAPS: 240.0000 mg | ORAL_CAPSULE | Freq: Every day | ORAL | 2 refills | Status: DC

## 2018-04-23 NOTE — Patient Instructions (Signed)

## 2018-04-23 NOTE — Progress Notes (Signed)
    Subjective:  Victoria Frye is a 17 y.o. G2P0010 at 1960w3d being seen today for ongoing prenatal care.  She is currently monitored for the following issues for this low-risk pregnancy and has Supervision of normal first pregnancy, antepartum and Anemia affecting pregnancy in third trimester on their problem list.  Patient reports had pain in LUQ once for about an hour and it spontaneously resolved and has not returned..  Contractions: Not present. Vag. Bleeding: None.  Movement: Present. Denies leaking of fluid.   The following portions of the patient's history were reviewed and updated as appropriate: allergies, current medications, past family history, past medical history, past social history, past surgical history and problem list. Problem list updated.  Objective:   Vitals:   04/23/18 1107  BP: (!) 98/58  Pulse: 85  Weight: 107 lb 9.6 oz (48.8 kg)    Fetal Status: Fetal Heart Rate (bpm): 144 Fundal Height: 30 cm Movement: Present     General:  Alert, oriented and cooperative. Patient is in no acute distress.  Skin: Skin is warm and dry. No rash noted.   Cardiovascular: Normal heart rate noted  Respiratory: Normal respiratory effort, no problems with respiration noted  Abdomen: Soft, gravid, appropriate for gestational age. Pain/Pressure: Present     Pelvic:  Cervical exam deferred        Extremities: Normal range of motion.  Edema: None  Mental Status: Normal mood and affect. Normal behavior. Normal judgment and thought content.   Urinalysis:      Assessment and Plan:  Pregnancy: G2P0010 at 1960w3d  1. Supervision of normal first pregnancy, antepartum  2.  Anemia One feraheme infusion done.  Started iron supplement.  Prescribed stool softener today.  Preterm labor symptoms and general obstetric precautions including but not limited to vaginal bleeding, contractions, leaking of fluid and fetal movement were reviewed in detail with the patient. Please refer to After Visit  Summary for other counseling recommendations.  Return in about 2 weeks (around 05/07/2018).  Nolene BernheimERRI Alfretta Pinch, RN, MSN, NP-BC Nurse Practitioner, [facility], [facility] 04/23/2018 11:34 AM

## 2018-05-08 ENCOUNTER — Ambulatory Visit (INDEPENDENT_AMBULATORY_CARE_PROVIDER_SITE_OTHER): Payer: 59 | Admitting: Obstetrics and Gynecology

## 2018-05-08 VITALS — BP 106/71 | HR 96 | Wt 111.8 lb

## 2018-05-08 DIAGNOSIS — D508 Other iron deficiency anemias: Secondary | ICD-10-CM

## 2018-05-08 DIAGNOSIS — Z3403 Encounter for supervision of normal first pregnancy, third trimester: Secondary | ICD-10-CM

## 2018-05-08 NOTE — Progress Notes (Addendum)
   PRENATAL VISIT NOTE  Subjective:  Victoria Frye is a 17 y.o. G2P0010 at 5146w4d being seen today for ongoing prenatal care.  She is currently monitored for the following issues for this low-risk pregnancy and has Supervision of normal first pregnancy, antepartum and Anemia affecting pregnancy in third trimester on their problem list.  Patient reports no bleeding, no contractions, no cramping and no leaking.  Contractions: Not present. Vag. Bleeding: None.  Movement: Present. Denies leaking of fluid. Taking iron supplements as prescribed without complaint.  The following portions of the patient's history were reviewed and updated as appropriate: allergies, current medications, past family history, past medical history, past social history, past surgical history and problem list. Problem list updated.  Objective:   Vitals:   05/08/18 1419  BP: 106/71  Pulse: 96  Weight: 111 lb 12.8 oz (50.7 kg)    Fetal Status: Fetal Heart Rate (bpm): 142 Fundal Height: 31 cm Movement: Present     General:  Alert, oriented and cooperative. Patient is in no acute distress.  Skin: Skin is warm and dry. No rash noted.   Cardiovascular: Normal heart rate noted  Respiratory: Normal respiratory effort, no problems with respiration noted  Abdomen: Soft, gravid, appropriate for gestational age.  Pain/Pressure: Present     Pelvic: Cervical exam deferred        Extremities: Normal range of motion.  Edema: None  Mental Status: Normal mood and affect. Normal behavior. Normal judgment and thought content.   Assessment and Plan:  Pregnancy: G2P0010 at 5046w4d Encounter for supervision of normal first pregnancy in third trimester  Other iron deficiency anemia Repeat CBC at 36wk visit. Preterm labor symptoms and general obstetric precautions including but not limited to vaginal bleeding, contractions, leaking of fluid and fetal movement were reviewed in detail with the patient. Please refer to After Visit Summary  for other counseling recommendations.   Future Appointments  Date Time Provider Department Center  05/22/2018  1:30 PM Raelyn Moraawson, Rolitta, CNM [facility] None  06/05/2018  1:30 PM Raelyn Moraawson, Rolitta, CNM [facility] None  06/12/2018  1:30 PM Raelyn Moraawson, Rolitta, CNM [facility] None  06/19/2018  1:30 PM Raelyn Moraawson, Rolitta, CNM [facility] None  06/26/2018  1:30 PM Raelyn Moraawson, Rolitta, CNM [facility] None    Edd ArbourJamilla Kjersti Dittmer, SNM

## 2018-05-14 IMAGING — US US PELVIS COMPLETE
1 series · 13 of 25 positions shown · non-contrast
Comparison: 04/21/2017

CLINICAL DATA: Vaginal bleeding. Miscarriage on 05/26/2017 with
bleeding off and on but heavy now.

EXAM:
TRANSABDOMINAL AND TRANSVAGINAL ULTRASOUND OF PELVIS
DOPPLER ULTRASOUND OF OVARIES
TECHNIQUE: Both transabdominal and transvaginal ultrasound examinations of the
pelvis were performed. Transabdominal technique was performed for
global imaging of the pelvis including uterus, ovaries, adnexal
regions, and pelvic cul-de-sac.
It was necessary to proceed with endovaginal exam following the
transabdominal exam to visualize the endometrium and ovaries. Color
and duplex Doppler ultrasound was utilized to evaluate blood flow to
the ovaries.

[Series 1: us pelvis complete · 0.15mm/px · 13 of 74 slices shown]
[im 1/74]
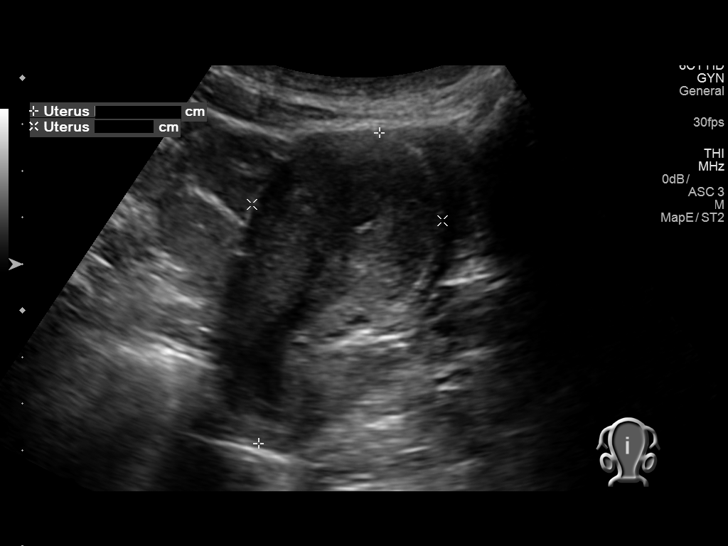
[im 7/74]
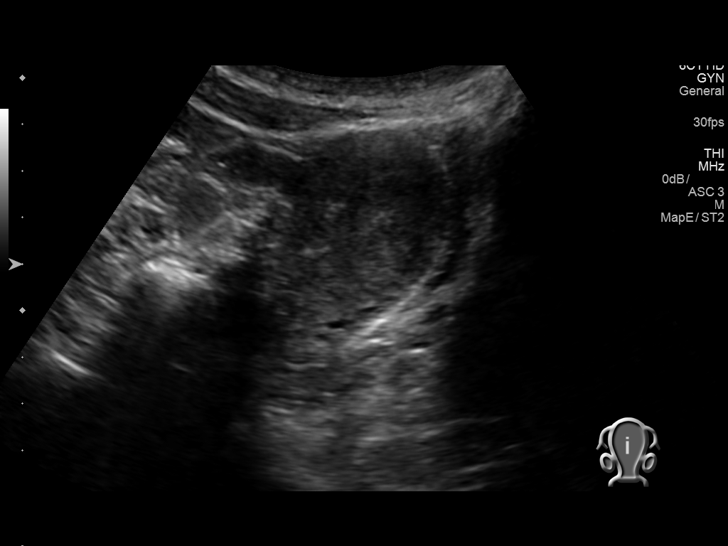
[im 13/74]
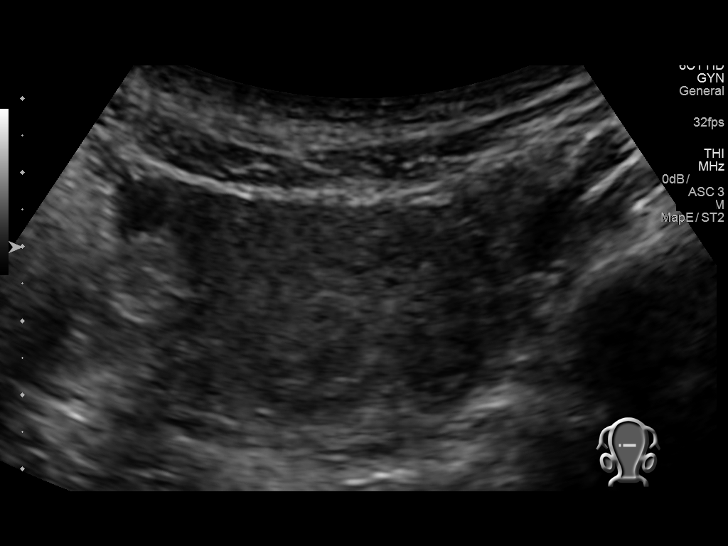
[im 19/74]
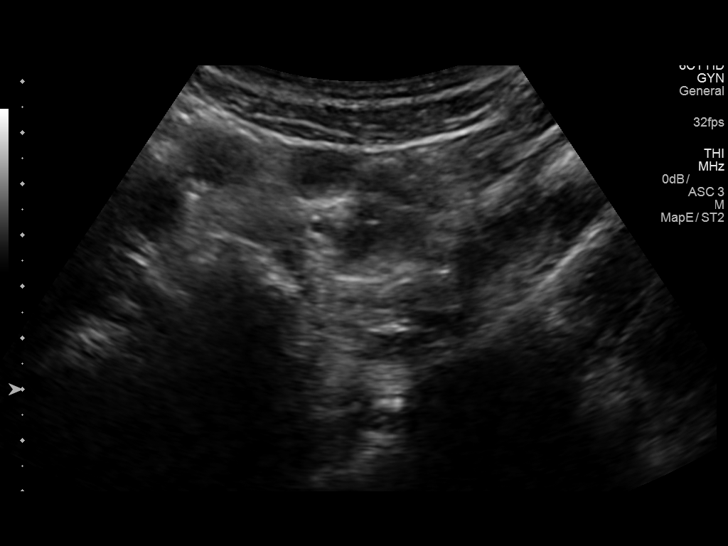
[im 25/74]
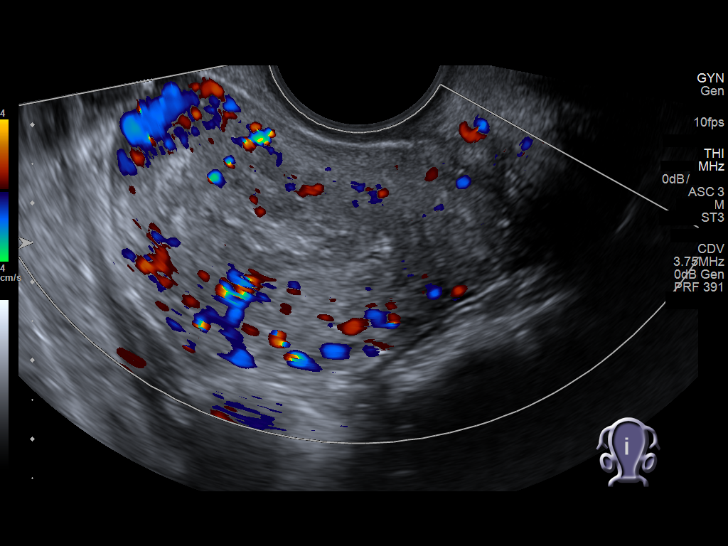
[im 31/74]
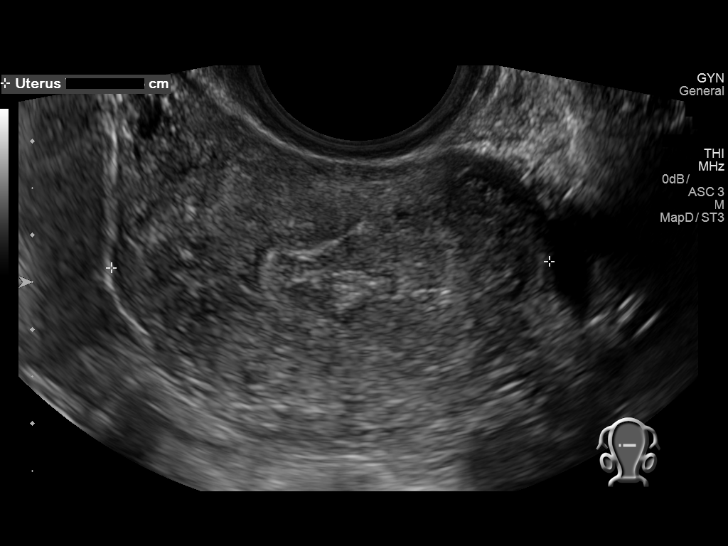
[im 37/74]
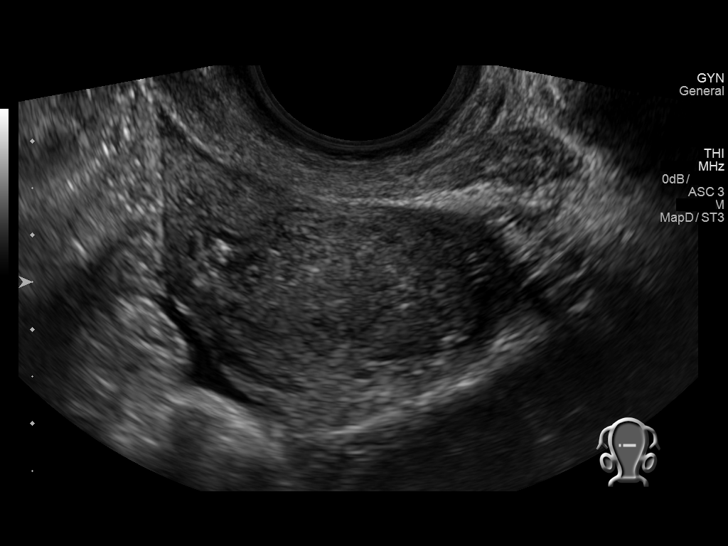
[im 43/74]
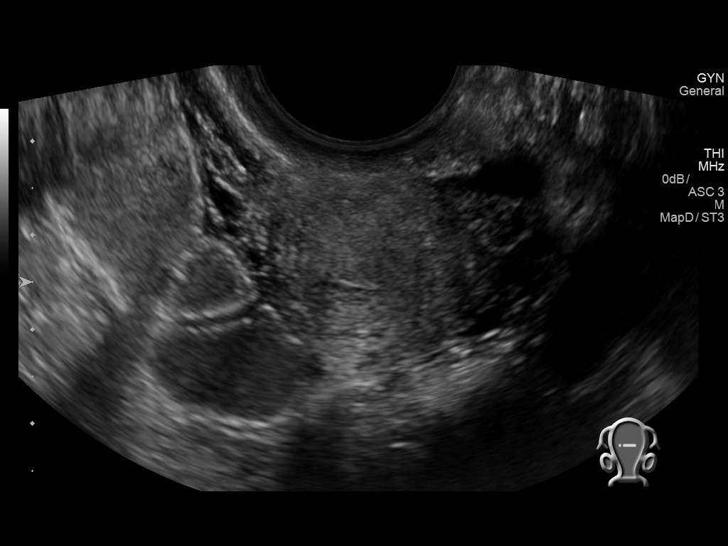
[im 49/74]
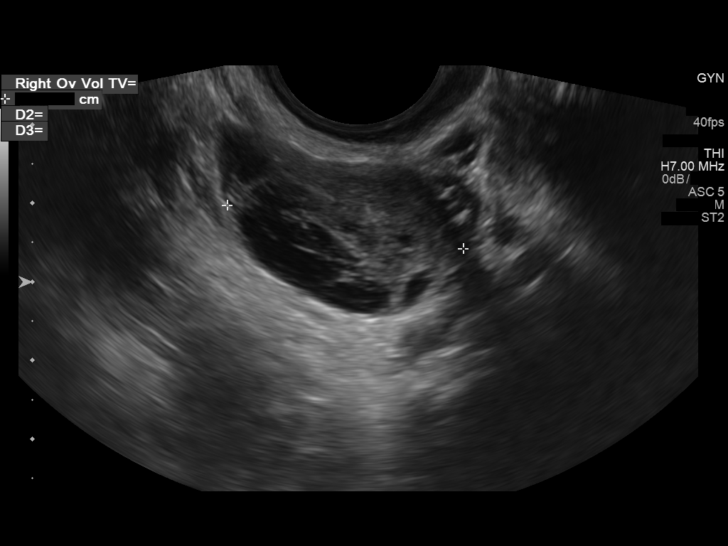
[im 55/74]
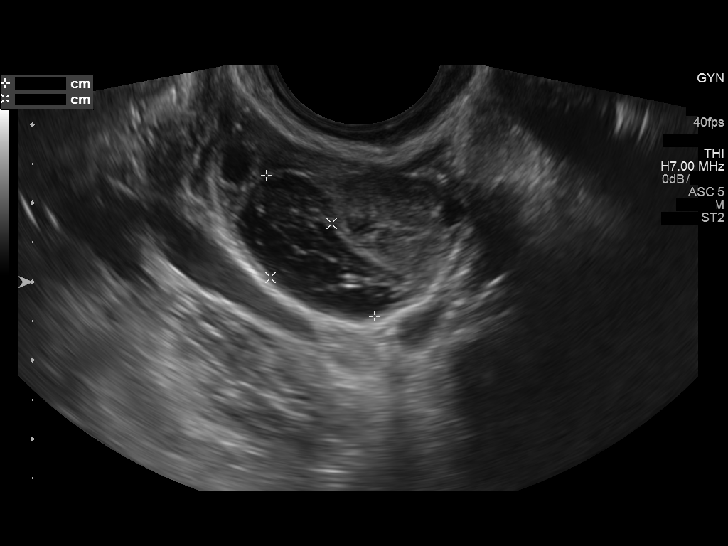
[im 61/74]
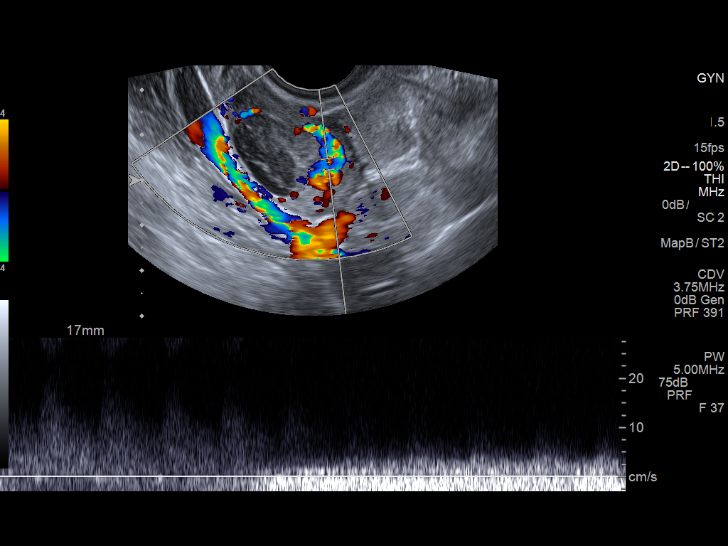
[im 67/74]
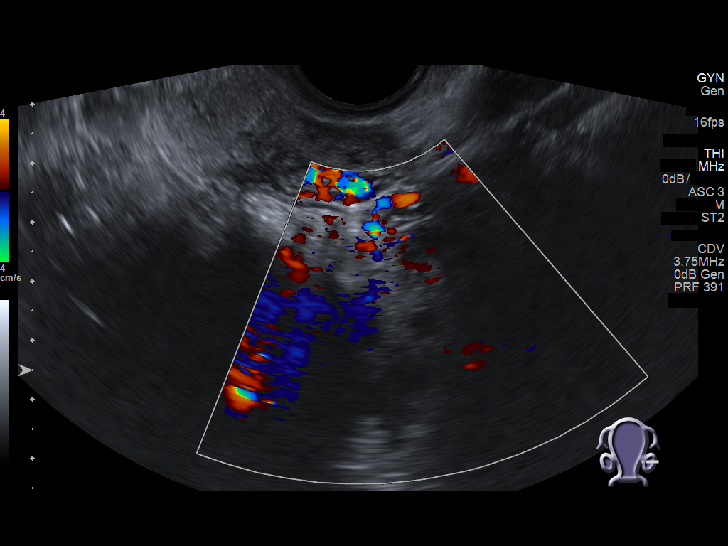
[im 74/74]
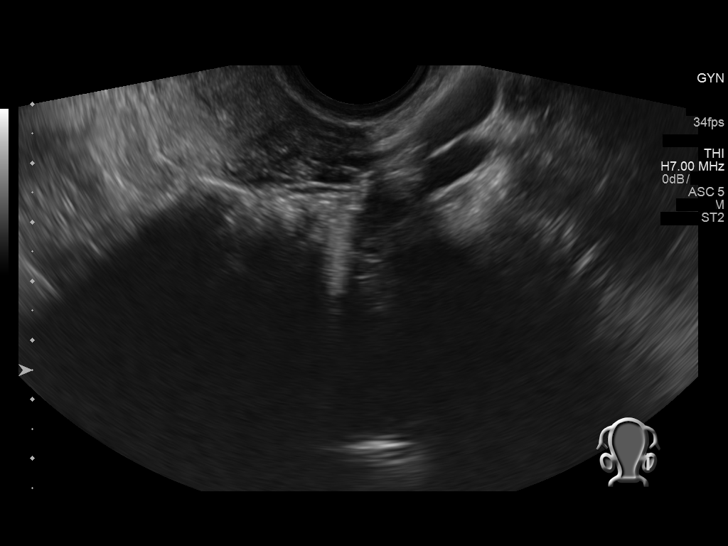

[13 of 25 positions shown; findings below may reference images not displayed]

FINDINGS: Uterus

Measurements: 6.6 x 3.4 x 4.7 cm. Uterus is anteverted. No fibroids
or other mass visualized.

Endometrium

Thickness: 10 mm. The endometrium is expanded with heterogeneous
hyperechoic material. Flow is demonstrated within the endometrium on
color flow Doppler imaging. Appearance is suspicious for retained
products of conception. No gestational sac is identified. Fluid is
demonstrated in the endocervical canal.

Right ovary

Measurements: 3.6 x 2.1 x 3.1 cm. A complex cyst with internal
debris is present measuring about 2.3 cm maximal diameter. No
internal flow is demonstrated. This likely represents a hemorrhagic
cyst.

Left ovary

Measurements: 3.2 x 2.3 x 2.2 cm. Normal appearance/no adnexal mass.

Pulsed Doppler evaluation of both ovaries demonstrates normal
low-resistance arterial and venous waveforms.

Other findings

Small amount of free fluid in the pelvis.
IMPRESSION: 1. Endometrium is expanded with heterogeneous hyperechoic material
and increased flow on color flow Doppler imaging. Appearance is
suspicious for retained products of conception. Fluid demonstrated
in the endocervical canal.
2. Complex cyst in the right ovary measuring 2.3 cm diameter, likely
representing hemorrhagic cyst.
3. No evidence of ovarian torsion.

## 2018-05-20 ENCOUNTER — Inpatient Hospital Stay (HOSPITAL_COMMUNITY)
Admission: AD | Admit: 2018-05-20 | Discharge: 2018-05-21 | Disposition: A | Discharge status: Home / Self Care | Payer: 59 | Attending: Obstetrics and Gynecology | Admitting: Obstetrics and Gynecology

## 2018-05-20 DIAGNOSIS — D649 Anemia, unspecified: Secondary | ICD-10-CM | POA: Insufficient documentation

## 2018-05-20 DIAGNOSIS — R1033 Periumbilical pain: Secondary | ICD-10-CM | POA: Insufficient documentation

## 2018-05-20 DIAGNOSIS — Z3A34 34 weeks gestation of pregnancy: Secondary | ICD-10-CM | POA: Insufficient documentation

## 2018-05-20 DIAGNOSIS — O26899 Other specified pregnancy related conditions, unspecified trimester: Secondary | ICD-10-CM

## 2018-05-20 DIAGNOSIS — O99013 Anemia complicating pregnancy, third trimester: Secondary | ICD-10-CM | POA: Insufficient documentation

## 2018-05-20 DIAGNOSIS — O26893 Other specified pregnancy related conditions, third trimester: Secondary | ICD-10-CM | POA: Insufficient documentation

## 2018-05-20 HISTORY — DX: Anemia, unspecified: D64.9

## 2018-05-21 ENCOUNTER — Encounter (HOSPITAL_COMMUNITY): Payer: Self-pay | Admitting: Emergency Medicine

## 2018-05-21 ENCOUNTER — Other Ambulatory Visit: Payer: Self-pay

## 2018-05-21 DIAGNOSIS — O26893 Other specified pregnancy related conditions, third trimester: Secondary | ICD-10-CM

## 2018-05-21 DIAGNOSIS — Z3A34 34 weeks gestation of pregnancy: Secondary | ICD-10-CM | POA: Diagnosis not present

## 2018-05-21 DIAGNOSIS — O99013 Anemia complicating pregnancy, third trimester: Secondary | ICD-10-CM | POA: Diagnosis not present

## 2018-05-21 DIAGNOSIS — R1033 Periumbilical pain: Secondary | ICD-10-CM | POA: Diagnosis not present

## 2018-05-21 DIAGNOSIS — D649 Anemia, unspecified: Secondary | ICD-10-CM | POA: Diagnosis not present

## 2018-05-21 DIAGNOSIS — O26899 Other specified pregnancy related conditions, unspecified trimester: Secondary | ICD-10-CM | POA: Diagnosis present

## 2018-05-21 LAB — URINALYSIS, ROUTINE W REFLEX MICROSCOPIC
Bilirubin Urine: NEGATIVE
Glucose, UA: 50 mg/dL — AB
HGB URINE DIPSTICK: NEGATIVE
Ketones, ur: NEGATIVE mg/dL
Nitrite: NEGATIVE
PROTEIN: 30 mg/dL — AB
Specific Gravity, Urine: 1.023 (ref 1.005–1.030)
pH: 7 (ref 5.0–8.0)

## 2018-05-21 LAB — CBC
HCT: 29.8 % — ABNORMAL LOW (ref 36.0–46.0)
HEMOGLOBIN: 9.3 g/dL — AB (ref 12.0–15.0)
MCH: 27.8 pg (ref 26.0–34.0)
MCHC: 31.2 g/dL (ref 30.0–36.0)
MCV: 89.2 fL (ref 80.0–100.0)
NRBC: 0 % (ref 0.0–0.2)
Platelets: 238 10*3/uL (ref 150–400)
RBC: 3.34 MIL/uL — ABNORMAL LOW (ref 3.87–5.11)
RDW: 16.5 % — ABNORMAL HIGH (ref 11.5–15.5)
WBC: 8.4 10*3/uL (ref 4.0–10.5)

## 2018-05-21 MED ORDER — COMFORT FIT MATERNITY SUPP SM MISC: 1.0000 [IU] | Freq: Every day | 0 refills | Status: DC | PRN

## 2018-05-21 NOTE — MAU Note (Signed)
Pt feeling abdominal pain below umbilicus. Says a lump developed on the site today.  Rating pain 4/10, says it's tender and tight. Denies and LOF, bleeding. +FM.

## 2018-05-21 NOTE — MAU Provider Note (Addendum)
History     CSN: [id number]  Arrival date and time: 05/20/18 2353   First Provider Initiated Contact with Patient 05/21/18 0051      Chief Complaint  Patient presents with  . Abdominal Pain   HPI  Ms.  Victoria Frye is a 17 y.o. year old 662P0010 female at 3961w3d weeks gestation who presents to MAU reporting pain underneath her belly button, "a lump or bar like area" formed today and is very tender to the touch (rated 4/10). She denies UC's, VB or LOF. She reports good (+) FM.  Past Medical History:  Diagnosis Date  . Anemia   . GERD (gastroesophageal reflux disease)   . Medical history non-contributory   . Miscarriage within last 12 months     Past Surgical History:  Procedure Laterality Date  . 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 DILATION AND EVACUATION N/A 07/08/2017   Procedure: DILATATION AND EVACUATION;  Surgeon: Adam PhenixArnold, James G, MD;  Location: [facility];  Service: Gynecology;  Laterality: N/A;    History reviewed. No pertinent family history.  Social History   Tobacco Use  . Smoking status: Never Smoker  . Smokeless tobacco: Never Used  Substance Use Topics  . Alcohol use: No  . Drug use: Yes    Types: Marijuana    Comment: not since pregnant. Per pt stopped 08/27/17    Allergies:  Allergies  Allergen Reactions  . Banana Anaphylaxis and Itching  . Other Itching    Onions    Medications Prior to Admission  Medication Sig Dispense Refill Last Dose  . ferrous sulfate 325 (65 FE) MG tablet Take 1 tablet by mouth twice a day with OJ. 60 tablet 3 Past Week at Unknown time  . Prenatal Vit-Fe Fumarate-FA (MULTIVITAMIN-PRENATAL) 27-0.8 MG TABS tablet Take 1 tablet by mouth daily at 12 noon.   05/20/2018 at Unknown time    Review of Systems  Constitutional: Negative.   HENT: Negative.   Eyes: Negative.   Respiratory: Negative.   Cardiovascular: Negative.   Gastrointestinal: Positive for abdominal pain ("It feels like a bar  underneath my belly button. It was tender earlier, but not as much now.").  Endocrine: Negative.   Genitourinary: Negative.   Musculoskeletal: Negative.   Skin: Negative.   Allergic/Immunologic: Negative.   Neurological: Negative.   Hematological: Negative.   Psychiatric/Behavioral: Negative.    Physical Exam   Blood pressure (!) 101/52, pulse 89, temperature 98.8 F (37.1 C), temperature source Oral, resp. rate 18, weight 51.8 kg, last menstrual period 09/24/2017, SpO2 99 %.  Physical Exam  Nursing note and vitals reviewed. Constitutional: She is oriented to person, place, and time. She appears well-developed and well-nourished.  HENT:  Head: Normocephalic and atraumatic.  Eyes: Pupils are equal, round, and reactive to light.  Neck: Normal range of motion.  Cardiovascular: Normal rate, regular rhythm and normal heart sounds.  Respiratory: Effort normal and breath sounds normal.  GI: Soft. Bowel sounds are normal. There is abdominal tenderness (~1.5 cm below umbilicus measuring 2 x 5 cm ).  Genitourinary:    Genitourinary Comments:  Dilation: Closed Effacement (%): Thick Cervical Position: Posterior Station: Ballotable Presentation: Vertex Exam by:: Carloyn Jaeger. Darald Uzzle, CNM    Musculoskeletal: Normal range of motion.  Neurological: She is alert and oriented to person, place, and time.  Skin: Skin is warm and dry.  Psychiatric: She has a normal mood and affect. Her behavior is normal. Judgment  and thought content normal.   *Picture taken of picture on patient's personal cell phone   MAU Course  Procedures  MDM CCUA UCx -- pending CBC NST - FHR: 130 bpm / moderate variability / accels present / decels absent / TOCO: UI noted   Results for orders placed or performed during the hospital encounter of 05/20/18 (from the past 24 hour(s))  Urinalysis, Routine w reflex microscopic     Status: Abnormal   Collection Time: 05/21/18 12:53 AM  Result Value Ref Range   Color, Urine  YELLOW YELLOW   APPearance CLOUDY (A) CLEAR   Specific Gravity, Urine 1.023 1.005 - 1.030   pH 7.0 5.0 - 8.0   Glucose, UA 50 (A) NEGATIVE mg/dL   Hgb urine dipstick NEGATIVE NEGATIVE   Bilirubin Urine NEGATIVE NEGATIVE   Ketones, ur NEGATIVE NEGATIVE mg/dL   Protein, ur 30 (A) NEGATIVE mg/dL   Nitrite NEGATIVE NEGATIVE   Leukocytes, UA LARGE (A) NEGATIVE   RBC / HPF 11-20 0 - 5 RBC/hpf   WBC, UA >50 (H) 0 - 5 WBC/hpf   Bacteria, UA FEW (A) NONE SEEN   Squamous Epithelial / LPF >50 (H) 0 - 5   Mucus PRESENT     Assessment and Plan  Periumbilical abdominal pain during pregnancy  - Soak in warm tub of water - Advised to take Tylenol 1000 mg po every 6 hrs prn - Rx for Maternity support belt given to add extra support to underside of abdomen - Information provided on umbilical hernia & abd pain in preg  Anemia affecting pregnancy in third trimester  - Reassurance to continue taking iron supplements as prescribed - Notified that iron level is improving from the last time it was drawn  - Discharge patient - Keep scheduled appt on 06/05/18 - Patient verbalized an understanding of the plan of care and agrees.     Victoria Moraolitta Bracy Pepper, MSN, CNM 05/21/2018, 12:51 AM

## 2018-05-21 NOTE — Discharge Instructions (Signed)
Take Tylenol 1000 mg every 6 hours for pain. 

## 2018-05-22 ENCOUNTER — Encounter: Payer: 59 | Admitting: Obstetrics and Gynecology

## 2018-05-22 LAB — CULTURE, OB URINE: Special Requests: NORMAL

## 2018-05-28 NOTE — L&D Delivery Note (Addendum)
Delivery Note At 6:33 AM a viable female was delivered via Vaginal, Spontaneous (Presentation: vertex; LOA).  APGAR: 2, 4; weight 6 lb 15.8 oz (3170 g).   Placenta status: delivered spontaneously, intact.  Cord: 3-vessel with the following complications: none.  Cord pH: pending  Anesthesia:  Epidural Episiotomy: None Lacerations: 1st degree perineal, hemostatic Suture Repair: None Est. Blood Loss (mL): 200  The patient was noted to be complete and pushing. Patient noted to have epidural anesthesia.   During active labor the patient was noted to have intermittent variables with continued moderate variablity with good return to baseline. The baby's strip was overall reassuring.  The patient was asked to push and the head delivered spontaneously in the LOA position, over an intact perineum.   The anterior shoulder delivered easily and the posterior shoulder followed. A nuchal cord was checked and baby was delivered through Somersault maneuver. The remainder of the infant was easily delivered and placed on mom's chest skin-to-skin where nursing personnel were in attendance. The oropharynx and nasopharynx were bulb suctioned. Due to lack of cry and stunned appearance, a CODE APGAR was called the cord was clamped after a 30-second delay and cut by father of the baby. The baby was then moved to the warmer where NICU staff were in attendance. The infant was placed on BBO2 and finally produced a cry around 10 minutes after birth. The baby was then moved to the NICU for closer observation.  Cord blood and cord pH were then obtained.   The placenta delivered intact spontaneously. Pitocin was started IV to firm the uterus.   Examination of the cervix and vaginal vault did not reveal any lacerations.  Examination of the perineum showed a 1st-degree perineal laceration that was hemostatic. No repair was indicated. A vaginal pack was then placed.  Post-Placental IUD Insertion Procedure Note  Patient  identified, informed consent signed prior to delivery, signed copy in chart, time out was performed.    Vaginal, labial and perineal areas thoroughly inspected for lacerations. 1st degree laceration identified - not repaired / hemostatic. Liletta IUD grasped between sterile gloved fingers. Sterile lubrication applied to sterile gloved hand for ease of insertion. Fundus identified through abdominal wall using non-insertion hand. IUD inserted to fundus with bimanual technique. IUD carefully released at the fundus and insertion hand gently removed from vagina. Strings trimmed to the level of the introitus. Patient tolerated procedure well.  Patient given post procedure instructions and IUD care card with expiration date.  Patient is asked to keep IUD strings tucked in her vagina until her postpartum follow up visit in 4-6 weeks. Patient advised to abstain from sexual intercourse and pulling on strings before her follow-up visit. Patient verbalized an understanding of the plan of care and agrees.   All sponge and needle counts were correct. Dr. Elita Frye was present for the entire procedure.   Mom to postpartum.  Baby to NICU.  Victoria Frye 06/28/2018, 8:05 AM  OB FELLOW DELIVERY ATTESTATION  I was gloved and present for the delivery in its entirety, and I agree with the above resident's note. PP IUD placement was performed by myself.   Victoria Frye, D.O. OB Fellow  06/29/2018, 5:03 PM

## 2018-06-03 ENCOUNTER — Telehealth: Payer: Self-pay | Admitting: Licensed Clinical Social Worker

## 2018-06-03 NOTE — Telephone Encounter (Signed)
Pt confirmed appt

## 2018-06-05 ENCOUNTER — Encounter: Payer: 59 | Admitting: Obstetrics and Gynecology

## 2018-06-05 ENCOUNTER — Ambulatory Visit (INDEPENDENT_AMBULATORY_CARE_PROVIDER_SITE_OTHER): Payer: 59 | Admitting: Obstetrics and Gynecology

## 2018-06-05 ENCOUNTER — Other Ambulatory Visit (HOSPITAL_COMMUNITY)
Admission: RE | Admit: 2018-06-05 | Discharge: 2018-06-05 | Disposition: A | Discharge status: Home / Self Care | Payer: 59 | Source: Ambulatory Visit | Attending: Obstetrics and Gynecology | Admitting: Obstetrics and Gynecology

## 2018-06-05 VITALS — BP 115/75 | HR 92 | Wt 116.2 lb

## 2018-06-05 DIAGNOSIS — Z3400 Encounter for supervision of normal first pregnancy, unspecified trimester: Secondary | ICD-10-CM

## 2018-06-05 DIAGNOSIS — O98813 Other maternal infectious and parasitic diseases complicating pregnancy, third trimester: Secondary | ICD-10-CM

## 2018-06-05 DIAGNOSIS — A749 Chlamydial infection, unspecified: Secondary | ICD-10-CM

## 2018-06-05 DIAGNOSIS — O98819 Other maternal infectious and parasitic diseases complicating pregnancy, unspecified trimester: Secondary | ICD-10-CM

## 2018-06-05 DIAGNOSIS — Z3A36 36 weeks gestation of pregnancy: Secondary | ICD-10-CM

## 2018-06-05 DIAGNOSIS — Z3403 Encounter for supervision of normal first pregnancy, third trimester: Secondary | ICD-10-CM

## 2018-06-05 LAB — OB RESULTS CONSOLE GBS: GBS: NEGATIVE

## 2018-06-05 NOTE — Progress Notes (Signed)
Subjective: Victoria Frye is a G2P0010 at [redacted]w[redacted]d who presents to the [facility] today for ob visit.  She does not have a history of any mental health concerns. She is not currently sexually active. She is currently using no method for birth control. Patient states father of baby as her support system.   BP 115/75   Pulse 92   Wt 116 lb 3.2 oz (52.7 kg)   LMP 09/24/2017   BMI 21.25 kg/m   Birth Control History:  No prior history   MDM Patient counseled on all options for birth control today including LARC. Patient desires PP IUD initiated for birth control.   Assessment:  18 y.o. female desires PP IUD for birth control  Plan: No further plan   Gwyndolyn Saxon, [place] 06/05/2018 3:51 PM

## 2018-06-05 NOTE — Progress Notes (Signed)
   PRENATAL VISIT NOTE  Subjective:  Victoria Frye is a 18 y.o. G2P0010 at 7579w4d being seen today for ongoing prenatal care.  She is currently monitored for the following issues for this low-risk pregnancy and has Supervision of normal first pregnancy, antepartum; Anemia affecting pregnancy in third trimester; and Periumbilical abdominal pain during pregnancy on their problem list.  Patient reports no complaints.  Contractions: Not present. Vag. Bleeding: None.  Movement: Present. Denies leaking of fluid.   The following portions of the patient's history were reviewed and updated as appropriate: allergies, current medications, past family history, past medical history, past social history, past surgical history and problem list. Problem list updated.  Objective:   Vitals:   06/05/18 1332  BP: 115/75  Pulse: 92  Weight: 52.7 kg    Fetal Status: Fetal Heart Rate (bpm): 153 Fundal Height: 36 cm Movement: Present  Presentation: Vertex  General:  Alert, oriented and cooperative. Patient is in no acute distress.  Skin: Skin is warm and dry. No rash noted.   Cardiovascular: Normal heart rate noted  Respiratory: Normal respiratory effort, no problems with respiration noted  Abdomen: Soft, gravid, appropriate for gestational age.  Pain/Pressure: Present     Pelvic: Cervical exam performed Dilation: Fingertip Effacement (%): 50 Station: -3  Extremities: Normal range of motion.  Edema: None  Mental Status: Normal mood and affect. Normal behavior. Normal judgment and thought content.   Assessment and Plan:  Pregnancy: G2P0010 at 2479w4d  1. Supervision of normal first pregnancy, antepartum - Cervicovaginal ancillary only( [place]) - Culture, beta strep (group b only)  Term labor symptoms and general obstetric precautions including but not limited to vaginal bleeding, contractions, leaking of fluid and fetal movement were reviewed in detail with the patient. Please refer to After Visit  Summary for other counseling recommendations.  Return in about 1 week (around 06/12/2018) for ROB.  Future Appointments  Date Time Provider Department Center  06/12/2018  1:30 PM Raelyn Moraawson, Rolitta, CNM [facility] None  06/19/2018  1:30 PM Raelyn Moraawson, Rolitta, CNM [facility] None  06/26/2018 10:30 AM Raelyn Moraawson, Rolitta, CNM [facility] None    Bernerd LimboJamilla R Nikholas Geffre, Student-MidWife

## 2018-06-06 LAB — CERVICOVAGINAL ANCILLARY ONLY
Chlamydia: POSITIVE — AB
Neisseria Gonorrhea: NEGATIVE

## 2018-06-07 ENCOUNTER — Other Ambulatory Visit (HOSPITAL_COMMUNITY): Payer: Self-pay | Admitting: Obstetrics and Gynecology

## 2018-06-07 DIAGNOSIS — A568 Sexually transmitted chlamydial infection of other sites: Secondary | ICD-10-CM | POA: Insufficient documentation

## 2018-06-07 DIAGNOSIS — O98313 Other infections with a predominantly sexual mode of transmission complicating pregnancy, third trimester: Secondary | ICD-10-CM

## 2018-06-07 HISTORY — DX: Other infections with a predominantly sexual mode of transmission complicating pregnancy, third trimester: A56.8

## 2018-06-07 HISTORY — DX: Other infections with a predominantly sexual mode of transmission complicating pregnancy, third trimester: O98.313

## 2018-06-07 MED ORDER — AZITHROMYCIN 500 MG PO TABS: 1000.0000 mg | ORAL_TABLET | Freq: Once | ORAL | 0 refills | Status: AC

## 2018-06-07 NOTE — Progress Notes (Signed)
TC to notify pt. No answer. No msg left.  [name], CNM

## 2018-06-09 ENCOUNTER — Telehealth: Payer: Self-pay | Admitting: *Deleted

## 2018-06-09 LAB — CULTURE, BETA STREP (GROUP B ONLY): Strep Gp B Culture: NEGATIVE

## 2018-06-09 MED ORDER — AZITHROMYCIN 500 MG PO TABS: 1000.0000 mg | ORAL_TABLET | Freq: Once | ORAL | 0 refills | Status: AC

## 2018-06-09 MED ORDER — AZITHROMYCIN 500 MG PO TABS: 1000.0000 mg | ORAL_TABLET | Freq: Once | ORAL | Status: DC

## 2018-06-09 NOTE — Addendum Note (Signed)
Addended by: Clovis Pu on: 06/09/2018 12:42 PM   Modules accepted: Orders

## 2018-06-09 NOTE — Addendum Note (Signed)
Addended by: Clovis Pu on: 06/09/2018 01:04 PM   Modules accepted: Orders, SmartSet

## 2018-06-09 NOTE — Telephone Encounter (Signed)
-----   Message from [name], [place] sent at 06/07/2018  9:27 AM EST ----- Please order Feraheme infusion x 2 for her. She is not taking FeSO4 and her HgB is 9.3

## 2018-06-11 ENCOUNTER — Other Ambulatory Visit (HOSPITAL_COMMUNITY): Payer: Self-pay | Admitting: *Deleted

## 2018-06-12 ENCOUNTER — Encounter: Payer: Self-pay | Admitting: General Practice

## 2018-06-12 ENCOUNTER — Ambulatory Visit (HOSPITAL_COMMUNITY)
Admission: RE | Admit: 2018-06-12 | Discharge: 2018-06-12 | Disposition: A | Discharge status: Home / Self Care | Payer: 59 | Source: Ambulatory Visit | Attending: Obstetrics and Gynecology | Admitting: Obstetrics and Gynecology

## 2018-06-12 ENCOUNTER — Ambulatory Visit (INDEPENDENT_AMBULATORY_CARE_PROVIDER_SITE_OTHER): Payer: 59 | Admitting: Obstetrics and Gynecology

## 2018-06-12 VITALS — BP 111/74 | HR 104 | Wt 118.2 lb

## 2018-06-12 DIAGNOSIS — O99013 Anemia complicating pregnancy, third trimester: Secondary | ICD-10-CM | POA: Insufficient documentation

## 2018-06-12 DIAGNOSIS — Z3A00 Weeks of gestation of pregnancy not specified: Secondary | ICD-10-CM | POA: Insufficient documentation

## 2018-06-12 DIAGNOSIS — D649 Anemia, unspecified: Secondary | ICD-10-CM | POA: Diagnosis not present

## 2018-06-12 DIAGNOSIS — Z3480 Encounter for supervision of other normal pregnancy, unspecified trimester: Secondary | ICD-10-CM

## 2018-06-12 MED ORDER — SODIUM CHLORIDE 0.9 % IV SOLN
510.0000 mg | INTRAVENOUS | Status: DC
  Administered 2018-06-12: 510 mg via INTRAVENOUS
  Filled 2018-06-12: qty 510

## 2018-06-12 NOTE — Progress Notes (Signed)
   PRENATAL VISIT NOTE  Subjective:  Victoria Frye is a 18 y.o. G2P0010 at 3241w4d being seen today for ongoing prenatal care.  She is currently monitored for the following issues for this low-risk pregnancy and has Supervision of normal first pregnancy, antepartum; Anemia affecting pregnancy in third trimester; Periumbilical abdominal pain during pregnancy; and Chlamydia trachomatis infection in mother during third trimester of pregnancy on their problem list.  Patient reports backache and occasional contractions.  Contractions: Irritability. Vag. Bleeding: None.  Movement: Present. Denies leaking of fluid. Desires NO or epidural for pain relief.  The following portions of the patient's history were reviewed and updated as appropriate: allergies, current medications, past family history, past medical history, past social history, past surgical history and problem list. Problem list updated.  Objective:   Vitals:   06/12/18 1338  BP: 111/74  Pulse: (!) 104  Weight: 53.6 kg    Fetal Status: Fetal Heart Rate (bpm): 165 Fundal Height: 37 cm Movement: Present     General:  Alert, oriented and cooperative. Patient is in no acute distress.  Skin: Skin is warm and dry. No rash noted.   Cardiovascular: Normal heart rate noted  Respiratory: Normal respiratory effort, no problems with respiration noted  Abdomen: Soft, gravid, appropriate for gestational age.  Pain/Pressure: Present     Pelvic: Cervical exam deferred        Extremities: Normal range of motion.  Edema: None  Mental Status: Normal mood and affect. Normal behavior. Normal judgment and thought content.   Assessment and Plan:  Pregnancy: G2P0010 at 8241w4d  1. Supervision of other normal pregnancy, antepartum - Discussed options for pain relief in labor  Term labor symptoms and general obstetric precautions including but not limited to vaginal bleeding, contractions, leaking of fluid and fetal movement were reviewed in detail with  the patient. Please refer to After Visit Summary for other counseling recommendations.  Return in about 1 week (around 06/19/2018) for ROB.  Future Appointments  Date Time Provider Department Center  06/19/2018  8:00 AM [facility] ROOM 4 [facility] None  06/19/2018  1:30 PM Raelyn Moraawson, Rolitta, CNM [facility] None  06/26/2018 10:30 AM Raelyn Moraawson, Rolitta, CNM [facility] None    Bernerd LimboJamilla R Doug Bucklin, Student-MidWife

## 2018-06-13 ENCOUNTER — Encounter (HOSPITAL_COMMUNITY): Payer: Self-pay | Admitting: *Deleted

## 2018-06-13 ENCOUNTER — Inpatient Hospital Stay (HOSPITAL_COMMUNITY)
Admission: AD | Admit: 2018-06-13 | Discharge: 2018-06-14 | Disposition: A | Discharge status: Home / Self Care | Payer: 59 | Source: Ambulatory Visit | Attending: Family Medicine | Admitting: Family Medicine

## 2018-06-13 DIAGNOSIS — O2343 Unspecified infection of urinary tract in pregnancy, third trimester: Secondary | ICD-10-CM | POA: Diagnosis not present

## 2018-06-13 DIAGNOSIS — O99891 Other specified diseases and conditions complicating pregnancy: Secondary | ICD-10-CM

## 2018-06-13 DIAGNOSIS — O26899 Other specified pregnancy related conditions, unspecified trimester: Secondary | ICD-10-CM

## 2018-06-13 DIAGNOSIS — A568 Sexually transmitted chlamydial infection of other sites: Secondary | ICD-10-CM

## 2018-06-13 DIAGNOSIS — O99013 Anemia complicating pregnancy, third trimester: Secondary | ICD-10-CM

## 2018-06-13 DIAGNOSIS — O98313 Other infections with a predominantly sexual mode of transmission complicating pregnancy, third trimester: Secondary | ICD-10-CM

## 2018-06-13 DIAGNOSIS — M545 Low back pain: Secondary | ICD-10-CM | POA: Diagnosis not present

## 2018-06-13 DIAGNOSIS — R1033 Periumbilical pain: Secondary | ICD-10-CM

## 2018-06-13 DIAGNOSIS — O26893 Other specified pregnancy related conditions, third trimester: Secondary | ICD-10-CM | POA: Diagnosis not present

## 2018-06-13 DIAGNOSIS — N3000 Acute cystitis without hematuria: Secondary | ICD-10-CM

## 2018-06-13 DIAGNOSIS — Z3A37 37 weeks gestation of pregnancy: Secondary | ICD-10-CM | POA: Diagnosis not present

## 2018-06-13 DIAGNOSIS — M549 Dorsalgia, unspecified: Secondary | ICD-10-CM

## 2018-06-13 DIAGNOSIS — Z3400 Encounter for supervision of normal first pregnancy, unspecified trimester: Secondary | ICD-10-CM

## 2018-06-13 DIAGNOSIS — O9989 Other specified diseases and conditions complicating pregnancy, childbirth and the puerperium: Secondary | ICD-10-CM

## 2018-06-13 NOTE — MAU Provider Note (Signed)
History    CSN: [id number] Arrival date and time: 06/13/18 2209 First Provider Initiated Contact with Patient 06/13/18 2315     Chief Complaint  Patient presents with  . Back Pain   HPI 17yo G2P0010 at [redacted]w[redacted]d who presents to MAU with back pain that started today. She states her pain started in her lower left abdomen, suprapubic area and then radiated to left lower back. She states pain is now going to bilateral shoulder blades and right mid back. Denies contractions, vaginal bleeding, leakage of fluids. Reports normal fetal movement. Mother has tried massaging her back with minimal improvement in symptoms.   OB History    Gravida  2   Para      Term      Preterm      AB  1   Living        SAB  1   TAB      Ectopic      Multiple      Live Births              Past Medical History:  Diagnosis Date  . Anemia   . GERD (gastroesophageal reflux disease)   . Medical history non-contributory   . Miscarriage within last 12 months     Past Surgical History:  Procedure Laterality Date  . 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 DILATION AND EVACUATION N/A 07/08/2017   Procedure: DILATATION AND EVACUATION;  Surgeon: Adam Phenix, MD;  Location: [facility];  Service: Gynecology;  Laterality: N/A;    History reviewed. No pertinent family history.  Social History   Tobacco Use  . Smoking status: Never Smoker  . Smokeless tobacco: Never Used  Substance Use Topics  . Alcohol use: No  . Drug use: Yes    Types: Marijuana    Comment: not since pregnant. Per pt stopped 08/27/17    Allergies:  Allergies  Allergen Reactions  . Banana Anaphylaxis and Itching  . Other Itching    Onions    No medications prior to admission.    Review of Systems  Constitutional: Positive for fatigue. Negative for activity change.  Respiratory: Negative for shortness of breath.   Cardiovascular: Negative for chest pain, palpitations and leg swelling.   Gastrointestinal: Positive for abdominal pain. Negative for nausea and vomiting.  Genitourinary: Positive for flank pain. Negative for dysuria, pelvic pain, vaginal bleeding and vaginal discharge.  Musculoskeletal: Positive for back pain and myalgias.  Neurological: Negative for dizziness and headaches.  Psychiatric/Behavioral: The patient is not nervous/anxious.    Physical Exam   Blood pressure (!) 97/56, pulse 97, temperature 98.8 F (37.1 C), resp. rate 16, height 5' 1.5" (1.562 m), weight 54 kg, last menstrual period 09/24/2017, SpO2 99 %.  Physical Exam  Nursing note and vitals reviewed. Constitutional: She is oriented to person, place, and time. She appears well-developed and well-nourished. No distress.  HENT:  Head: Normocephalic and atraumatic.  Eyes: Conjunctivae and EOM are normal.  Cardiovascular: Normal rate.  Respiratory: No respiratory distress.  GI: Soft. There is no abdominal tenderness.  gravid  Genitourinary:    Genitourinary Comments: SVE: 1/50/ballotable  L CVA tenderness    Musculoskeletal:     Comments: Thoracic paraspinal TTP  Neurological: She is alert and oriented to person, place, and time.  Psychiatric: She has a normal mood and affect. Her behavior is normal.   MAU Course  Procedures  MDM --  Reactive NST: 120s/mod/+a/-d; no contractions -- U/A with evidence of UTI - will treat in setting of flank and suprapubic pain  Assessment and Plan  17yo G2P0010 at [redacted]w[redacted]d presenting with suprapubic and back pain. Evidence of UTI, will treat with Keflex since > 37 weeks. OB urine culture pending. No contractions and SVE with minimal change, not in labor. Recommended heat packs and using maternity belt for comfort. Term labor precautions reviewed prior to discharge. Patient and mother voiced understanding of plan. All questions answered, stable for discharge home.   Tamera Stands, DO  06/14/2018, 2:54 AM

## 2018-06-13 NOTE — MAU Note (Signed)
Pt reports really sharp pain that started in her lower abd and after a while it radiated to her back and pain has been constant since 8 pm. Denies bleeding.

## 2018-06-14 DIAGNOSIS — N3000 Acute cystitis without hematuria: Secondary | ICD-10-CM | POA: Diagnosis not present

## 2018-06-14 LAB — URINALYSIS, ROUTINE W REFLEX MICROSCOPIC
Bilirubin Urine: NEGATIVE
GLUCOSE, UA: 150 mg/dL — AB
Hgb urine dipstick: NEGATIVE
Ketones, ur: NEGATIVE mg/dL
Nitrite: NEGATIVE
PROTEIN: NEGATIVE mg/dL
Specific Gravity, Urine: 1.004 — ABNORMAL LOW (ref 1.005–1.030)
pH: 7 (ref 5.0–8.0)

## 2018-06-14 MED ORDER — CEPHALEXIN 500 MG PO CAPS: 500.0000 mg | ORAL_CAPSULE | Freq: Four times a day (QID) | ORAL | 0 refills | Status: AC

## 2018-06-14 MED ORDER — ACETAMINOPHEN 325 MG PO TABS: 650.0000 mg | ORAL_TABLET | Freq: Four times a day (QID) | ORAL | Status: DC | PRN

## 2018-06-15 LAB — CULTURE, OB URINE: Culture: NO GROWTH

## 2018-06-19 ENCOUNTER — Inpatient Hospital Stay (HOSPITAL_COMMUNITY): Admission: RE | Admit: 2018-06-19 | Payer: 59 | Source: Ambulatory Visit

## 2018-06-19 ENCOUNTER — Other Ambulatory Visit (HOSPITAL_COMMUNITY)
Admission: RE | Admit: 2018-06-19 | Discharge: 2018-06-19 | Disposition: A | Discharge status: Home / Self Care | Payer: 59 | Source: Ambulatory Visit | Attending: Obstetrics and Gynecology | Admitting: Obstetrics and Gynecology

## 2018-06-19 ENCOUNTER — Ambulatory Visit (INDEPENDENT_AMBULATORY_CARE_PROVIDER_SITE_OTHER): Payer: 59 | Admitting: Obstetrics and Gynecology

## 2018-06-19 VITALS — BP 92/61 | HR 99 | Wt 118.0 lb

## 2018-06-19 DIAGNOSIS — Z3400 Encounter for supervision of normal first pregnancy, unspecified trimester: Secondary | ICD-10-CM | POA: Insufficient documentation

## 2018-06-19 DIAGNOSIS — Z3403 Encounter for supervision of normal first pregnancy, third trimester: Secondary | ICD-10-CM

## 2018-06-19 NOTE — Progress Notes (Signed)
   PRENATAL VISIT NOTE  Subjective:  Victoria Frye is a 18 y.o. G2P0010 at [redacted]w[redacted]d being seen today for ongoing prenatal care.  She is currently monitored for the following issues for this low-risk pregnancy and has Supervision of normal first pregnancy, antepartum; Anemia affecting pregnancy in third trimester; Periumbilical abdominal pain during pregnancy; and Chlamydia trachomatis infection in mother during third trimester of pregnancy on their problem list.  Patient reports fatigue and some pelvic pressure and a mild cold.  Contractions: Irritability. Vag. Bleeding: None.  Movement: Present. Denies leaking of fluid. Overslept for her iron infusion appointment this morning, and has not gotten iron pills because the prescription was not ready the last time she went to the pharmacy. Has not been drinking much fluids (nothing today). States her cold is mild, hasn't been taking anything for it and has not had a fever.   The following portions of the patient's history were reviewed and updated as appropriate: allergies, current medications, past family history, past medical history, past social history, past surgical history and problem list. Problem list updated.  Objective:   Vitals:   06/19/18 1339  BP: 92/61  Pulse: 99  Weight: 53.5 kg    Fetal Status: Fetal Heart Rate (bpm): 136 Fundal Height: 38 cm Movement: Present     ROA by Leopold's maneuvers  General:  Alert, oriented and cooperative. Patient ill-appearing but afebrile.  Skin: Skin is warm and dry. No rash noted.   Cardiovascular: Normal heart rate noted  Respiratory: Normal respiratory effort, no problems with respiration noted.  Abdomen: Soft, gravid, appropriate for gestational age.  Pain/Pressure: Present     Pelvic: Cervical exam deferred      UA showed very dark, cloudy urine with a strong odor.  Extremities: Normal range of motion.  Edema: None  Mental Status: Normal mood, slightly flat affect. Normal behavior. Normal  judgment and thought content.   Assessment and Plan:  Pregnancy: G2P0010 at [redacted]w[redacted]d  1. Supervision of normal first pregnancy, antepartum - Encouraged to drink more fluids, pick up iron prescription, and return for iron infusion.  - Discussed increased risk of postpartum hemorrhage from anemia, and the role iron plays in energy levels. - Urine cytology ancillary only([place])  Term labor symptoms and general obstetric precautions including but not limited to vaginal bleeding, contractions, leaking of fluid and fetal movement were reviewed in detail with the patient. Please refer to After Visit Summary for other counseling recommendations.   Future Appointments  Date Time Provider Department Center  06/26/2018 10:30 AM [name], CNM [facility] None    Bernerd Limbo, Student-MidWife

## 2018-06-20 LAB — URINE CYTOLOGY ANCILLARY ONLY
Chlamydia: NEGATIVE
Neisseria Gonorrhea: NEGATIVE

## 2018-06-21 ENCOUNTER — Encounter (HOSPITAL_COMMUNITY): Payer: Self-pay | Admitting: *Deleted

## 2018-06-21 ENCOUNTER — Inpatient Hospital Stay (HOSPITAL_COMMUNITY)
Admission: AD | Admit: 2018-06-21 | Discharge: 2018-06-21 | Disposition: A | Discharge status: Home / Self Care | Payer: 59 | Source: Ambulatory Visit | Attending: Obstetrics & Gynecology | Admitting: Obstetrics & Gynecology

## 2018-06-21 ENCOUNTER — Other Ambulatory Visit: Payer: Self-pay

## 2018-06-21 DIAGNOSIS — O479 False labor, unspecified: Secondary | ICD-10-CM

## 2018-06-21 DIAGNOSIS — O368130 Decreased fetal movements, third trimester, not applicable or unspecified: Secondary | ICD-10-CM | POA: Diagnosis not present

## 2018-06-21 DIAGNOSIS — Z3689 Encounter for other specified antenatal screening: Secondary | ICD-10-CM

## 2018-06-21 DIAGNOSIS — O471 False labor at or after 37 completed weeks of gestation: Secondary | ICD-10-CM

## 2018-06-21 DIAGNOSIS — Z3A38 38 weeks gestation of pregnancy: Secondary | ICD-10-CM | POA: Diagnosis not present

## 2018-06-21 NOTE — MAU Provider Note (Signed)
History     CSN: [id number]  Arrival date and time: 06/21/18 [id number]   First Provider Initiated Contact with Patient 06/21/18 1958      Chief Complaint  Patient presents with  . Decreased Fetal Movement   HPI Ms. Victoria Frye is a 18 y.o. G2P0010 at 1082w6d who presents to MAU today with complaint of decreased fetal movement for 2 days. She states baby has moved "not much" today. She states few irregular mild contractions. She denies bleeding, LOF or complications with the pregnancy.    OB History    Gravida  2   Para      Term      Preterm      AB  1   Living        SAB  1   TAB      Ectopic      Multiple      Live Births              Past Medical History:  Diagnosis Date  . Anemia   . GERD (gastroesophageal reflux disease)   . Medical history non-contributory   . Miscarriage within last 12 months     Past Surgical History:  Procedure Laterality Date  . 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 DILATION AND EVACUATION N/A 07/08/2017   Procedure: DILATATION AND EVACUATION;  Surgeon: Adam PhenixArnold, James G, MD;  Location: [facility];  Service: Gynecology;  Laterality: N/A;    History reviewed. No pertinent family history.  Social History   Tobacco Use  . Smoking status: Never Smoker  . Smokeless tobacco: Never Used  Substance Use Topics  . Alcohol use: No  . Drug use: Not Currently    Types: Marijuana    Comment: not since pregnant. Per pt stopped 08/27/17    Allergies:  Allergies  Allergen Reactions  . Banana Anaphylaxis and Itching  . Other Itching    Onions    Medications Prior to Admission  Medication Sig Dispense Refill Last Dose  . acetaminophen (TYLENOL) 325 MG tablet Take 2 tablets (650 mg total) by mouth every 6 (six) hours as needed (pain).   Past Week at Unknown time  . ferrous sulfate 325 (65 FE) MG tablet Take 1 tablet by mouth twice a day with OJ. 60 tablet 3 06/21/2018 at Unknown time  . Prenatal Vit-Fe  Fumarate-FA (MULTIVITAMIN-PRENATAL) 27-0.8 MG TABS tablet Take 1 tablet by mouth daily at 12 noon.   Past Week at Unknown time  . Elastic Bandages & Supports (COMFORT FIT MATERNITY SUPP SM) MISC 1 Units by Does not apply route daily as needed. (Patient not taking: Reported on 06/05/2018) 1 each 0 Not Taking    Review of Systems  Constitutional: Negative for fever.  Gastrointestinal: Negative for abdominal pain.  Genitourinary: Negative for vaginal bleeding and vaginal discharge.   Physical Exam   Blood pressure 121/74, pulse 79, temperature 97.8 F (36.6 C), temperature source Oral, resp. rate 17, last menstrual period 09/24/2017.  Physical Exam  Nursing note and vitals reviewed. Constitutional: She is oriented to person, place, and time. She appears well-developed and well-nourished. No distress.  HENT:  Head: Normocephalic and atraumatic.  Cardiovascular: Normal rate.  Respiratory: Effort normal.  GI: Soft. She exhibits no distension and no mass. There is no abdominal tenderness. There is no rebound and no guarding.  Neurological: She is alert and oriented to person, place, and time.  Skin: Skin  is warm and dry. No erythema.  Psychiatric: She has a normal mood and affect.    Fetal Monitoring: Baseline: 130 bpm Variability: moderate Accelerations: 15 x 15 Decelerations: one variable Contractions: irregular   MAU Course  Procedures None  MDM NST today  Assessment and Plan  A: SIUP at 10782w6d Reactive NST  P: Discharge home Labor precautions and kick counts discussed Patient advised to follow-up with [facility] as scheduled for routine prenatal care  Patient may return to MAU as needed or if her condition were to change or worsen  Vonzella NippleJulie , PA-C 06/21/2018, 7:59 PM

## 2018-06-21 NOTE — Discharge Instructions (Signed)

## 2018-06-21 NOTE — MAU Note (Signed)
Pt reports to MAU c/o DFM for the last 2days. Pt reports slight movements that occasionally occurs but states he is definitely not moving like he normally does. Pt has only felt 20 kicks all day today since 0400. Pt reports occasional ctx or tightening in her belly but states its irregular. No bleeding or LOF. Vaginal pressure noted.

## 2018-06-26 ENCOUNTER — Encounter: Payer: 59 | Admitting: Obstetrics and Gynecology

## 2018-06-26 ENCOUNTER — Encounter (HOSPITAL_COMMUNITY): Payer: Self-pay

## 2018-06-26 ENCOUNTER — Other Ambulatory Visit: Payer: Self-pay

## 2018-06-26 ENCOUNTER — Encounter (HOSPITAL_COMMUNITY): Payer: Self-pay | Admitting: *Deleted

## 2018-06-26 ENCOUNTER — Inpatient Hospital Stay (HOSPITAL_COMMUNITY)
Admission: AD | Admit: 2018-06-26 | Discharge: 2018-06-26 | Disposition: A | Discharge status: Home / Self Care | Payer: 59 | Source: Home / Self Care | Attending: Obstetrics and Gynecology | Admitting: Obstetrics and Gynecology

## 2018-06-26 DIAGNOSIS — R109 Unspecified abdominal pain: Secondary | ICD-10-CM | POA: Insufficient documentation

## 2018-06-26 DIAGNOSIS — Z3400 Encounter for supervision of normal first pregnancy, unspecified trimester: Secondary | ICD-10-CM

## 2018-06-26 DIAGNOSIS — Z3A39 39 weeks gestation of pregnancy: Secondary | ICD-10-CM

## 2018-06-26 DIAGNOSIS — Z3483 Encounter for supervision of other normal pregnancy, third trimester: Secondary | ICD-10-CM | POA: Insufficient documentation

## 2018-06-26 DIAGNOSIS — O98313 Other infections with a predominantly sexual mode of transmission complicating pregnancy, third trimester: Principal | ICD-10-CM

## 2018-06-26 DIAGNOSIS — A568 Sexually transmitted chlamydial infection of other sites: Secondary | ICD-10-CM

## 2018-06-26 DIAGNOSIS — D649 Anemia, unspecified: Secondary | ICD-10-CM | POA: Insufficient documentation

## 2018-06-26 DIAGNOSIS — O99013 Anemia complicating pregnancy, third trimester: Secondary | ICD-10-CM | POA: Insufficient documentation

## 2018-06-26 DIAGNOSIS — O26899 Other specified pregnancy related conditions, unspecified trimester: Secondary | ICD-10-CM

## 2018-06-26 DIAGNOSIS — R1033 Periumbilical pain: Secondary | ICD-10-CM

## 2018-06-26 MED ORDER — BUTORPHANOL TARTRATE 1 MG/ML IJ SOLN
1.0000 mg | Freq: Once | INTRAMUSCULAR | Status: AC
  Administered 2018-06-26: 1 mg via INTRAMUSCULAR
  Filled 2018-06-26: qty 1

## 2018-06-26 NOTE — MAU Note (Signed)
Pt presents to MAU c/o ctx every . No bleeding or LOF. +FM.

## 2018-06-26 NOTE — Discharge Instructions (Signed)
Braxton Hicks Contractions °Contractions of the uterus can occur throughout pregnancy, but they are not always a sign that you are in labor. You may have practice contractions called Braxton Hicks contractions. These false labor contractions are sometimes confused with true labor. °What are Braxton Hicks contractions? °Braxton Hicks contractions are tightening movements that occur in the muscles of the uterus before labor. Unlike true labor contractions, these contractions do not result in opening (dilation) and thinning of the cervix. Toward the end of pregnancy (32-34 weeks), Braxton Hicks contractions can happen more often and may become stronger. These contractions are sometimes difficult to tell apart from true labor because they can be very uncomfortable. You should not feel embarrassed if you go to the hospital with false labor. °Sometimes, the only way to tell if you are in true labor is for your health care provider to look for changes in the cervix. The health care provider will do a physical exam and may monitor your contractions. If you are not in true labor, the exam should show that your cervix is not dilating and your water has not broken. °If there are no other health problems associated with your pregnancy, it is completely safe for you to be sent home with false labor. You may continue to have Braxton Hicks contractions until you go into true labor. °How to tell the difference between true labor and false labor °True labor °· Contractions last 30-70 seconds. °· Contractions become very regular. °· Discomfort is usually felt in the top of the uterus, and it spreads to the lower abdomen and low back. °· Contractions do not go away with walking. °· Contractions usually become more intense and increase in frequency. °· The cervix dilates and gets thinner. °False labor °· Contractions are usually shorter and not as strong as true labor contractions. °· Contractions are usually irregular. °· Contractions  are often felt in the front of the lower abdomen and in the groin. °· Contractions may go away when you walk around or change positions while lying down. °· Contractions get weaker and are shorter-lasting as time goes on. °· The cervix usually does not dilate or become thin. °Follow these instructions at home: ° °· Take over-the-counter and prescription medicines only as told by your health care provider. °· Keep up with your usual exercises and follow other instructions from your health care provider. °· Eat and drink lightly if you think you are going into labor. °· If Braxton Hicks contractions are making you uncomfortable: °? Change your position from lying down or resting to walking, or change from walking to resting. °? Sit and rest in a tub of warm water. °? Drink enough fluid to keep your urine pale yellow. Dehydration may cause these contractions. °? Do slow and deep breathing several times an hour. °· Keep all follow-up prenatal visits as told by your health care provider. This is important. °Contact a health care provider if: °· You have a fever. °· You have continuous pain in your abdomen. °Get help right away if: °· Your contractions become stronger, more regular, and closer together. °· You have fluid leaking or gushing from your vagina. °· You have bleeding from your vagina. °· You have low back pain that you never had before. °· You feel your baby’s head pushing down and causing pelvic pressure. °· Your baby is not moving inside you as much as it used to. °Summary °· Contractions that occur before labor are called Braxton Hicks contractions, false labor, or   practice contractions. °· Braxton Hicks contractions are usually shorter, weaker, farther apart, and less regular than true labor contractions. True labor contractions usually become progressively stronger and regular, and they become more frequent. °· Manage discomfort from Braxton Hicks contractions by changing position, resting in a warm bath,  drinking plenty of water, or practicing deep breathing. °This information is not intended to replace advice given to you by your health care provider. Make sure you discuss any questions you have with your health care provider. °Document Released: 09/27/2016 Document Revised: 02/26/2017 Document Reviewed: 09/27/2016 °Elsevier Interactive Patient Education © 2019 [employer] ° °

## 2018-06-26 NOTE — MAU Note (Signed)
PT SAYS SHE WAS HERE THIS  AM- VE 1-2 CM .  DENIES HSV AND MRSA.  GBS- NEG.    SAYS [facility] STRONGER

## 2018-06-27 ENCOUNTER — Inpatient Hospital Stay (HOSPITAL_COMMUNITY): Payer: 59 | Admitting: Anesthesiology

## 2018-06-27 ENCOUNTER — Other Ambulatory Visit: Payer: Self-pay

## 2018-06-27 ENCOUNTER — Inpatient Hospital Stay (HOSPITAL_COMMUNITY)
Admission: AD | Admit: 2018-06-27 | Discharge: 2018-06-30 | DRG: 806 | Disposition: A | Discharge status: Home / Self Care | Payer: 59 | Attending: Family Medicine | Admitting: Family Medicine

## 2018-06-27 ENCOUNTER — Encounter (HOSPITAL_COMMUNITY): Payer: Self-pay

## 2018-06-27 DIAGNOSIS — D649 Anemia, unspecified: Secondary | ICD-10-CM | POA: Diagnosis present

## 2018-06-27 DIAGNOSIS — O99013 Anemia complicating pregnancy, third trimester: Secondary | ICD-10-CM | POA: Diagnosis present

## 2018-06-27 DIAGNOSIS — O26893 Other specified pregnancy related conditions, third trimester: Secondary | ICD-10-CM | POA: Diagnosis present

## 2018-06-27 DIAGNOSIS — A568 Sexually transmitted chlamydial infection of other sites: Secondary | ICD-10-CM | POA: Diagnosis present

## 2018-06-27 DIAGNOSIS — O98313 Other infections with a predominantly sexual mode of transmission complicating pregnancy, third trimester: Secondary | ICD-10-CM | POA: Diagnosis present

## 2018-06-27 DIAGNOSIS — Z30430 Encounter for insertion of intrauterine contraceptive device: Secondary | ICD-10-CM

## 2018-06-27 DIAGNOSIS — R1033 Periumbilical pain: Secondary | ICD-10-CM

## 2018-06-27 DIAGNOSIS — O9902 Anemia complicating childbirth: Secondary | ICD-10-CM | POA: Diagnosis present

## 2018-06-27 DIAGNOSIS — Z3A39 39 weeks gestation of pregnancy: Secondary | ICD-10-CM

## 2018-06-27 DIAGNOSIS — Z3400 Encounter for supervision of normal first pregnancy, unspecified trimester: Secondary | ICD-10-CM

## 2018-06-27 DIAGNOSIS — O26899 Other specified pregnancy related conditions, unspecified trimester: Secondary | ICD-10-CM

## 2018-06-27 LAB — CBC
HCT: 33.4 % — ABNORMAL LOW (ref 36.0–46.0)
Hemoglobin: 10.7 g/dL — ABNORMAL LOW (ref 12.0–15.0)
MCH: 28.2 pg (ref 26.0–34.0)
MCHC: 32 g/dL (ref 30.0–36.0)
MCV: 87.9 fL (ref 80.0–100.0)
Platelets: 210 10*3/uL (ref 150–400)
RBC: 3.8 MIL/uL — ABNORMAL LOW (ref 3.87–5.11)
RDW: 15 % (ref 11.5–15.5)
WBC: 11.8 10*3/uL — ABNORMAL HIGH (ref 4.0–10.5)
nRBC: 0 % (ref 0.0–0.2)

## 2018-06-27 LAB — TYPE AND SCREEN
ABO/RH(D): AB POS
Antibody Screen: NEGATIVE

## 2018-06-27 LAB — WET PREP, GENITAL
Clue Cells Wet Prep HPF POC: NONE SEEN
Sperm: NONE SEEN
Trich, Wet Prep: NONE SEEN
Yeast Wet Prep HPF POC: NONE SEEN

## 2018-06-27 LAB — POCT FERN TEST: POCT Fern Test: NEGATIVE

## 2018-06-27 LAB — ABO/RH: ABO/RH(D): AB POS

## 2018-06-27 MED ORDER — LIDOCAINE HCL (PF) 1 % IJ SOLN
INTRAMUSCULAR | Status: DC | PRN
  Administered 2018-06-27 (×2): 3 mL via EPIDURAL

## 2018-06-27 MED ORDER — EPHEDRINE 5 MG/ML INJ
10.0000 mg | INTRAVENOUS | Status: DC | PRN
  Filled 2018-06-27: qty 2

## 2018-06-27 MED ORDER — OXYCODONE-ACETAMINOPHEN 5-325 MG PO TABS: 1.0000 | ORAL_TABLET | ORAL | Status: DC | PRN

## 2018-06-27 MED ORDER — ACETAMINOPHEN 325 MG PO TABS
650.0000 mg | ORAL_TABLET | ORAL | Status: DC | PRN
  Administered 2018-06-28: 650 mg via ORAL
  Filled 2018-06-27: qty 2

## 2018-06-27 MED ORDER — DIPHENHYDRAMINE HCL 50 MG/ML IJ SOLN: 12.5000 mg | INTRAMUSCULAR | Status: DC | PRN

## 2018-06-27 MED ORDER — FENTANYL CITRATE (PF) 100 MCG/2ML IJ SOLN
100.0000 ug | Freq: Once | INTRAMUSCULAR | Status: AC
  Administered 2018-06-27: 100 ug via INTRAVENOUS
  Filled 2018-06-27: qty 2

## 2018-06-27 MED ORDER — LACTATED RINGERS IV SOLN
500.0000 mL | INTRAVENOUS | Status: DC | PRN
  Administered 2018-06-28: 500 mL via INTRAVENOUS

## 2018-06-27 MED ORDER — LEVONORGESTREL 19.5 MCG/DAY IU IUD
INTRAUTERINE_SYSTEM | Freq: Once | INTRAUTERINE | Status: AC
  Administered 2018-06-28: 1 via INTRAUTERINE
  Filled 2018-06-27: qty 1

## 2018-06-27 MED ORDER — OXYTOCIN 40 UNITS IN NORMAL SALINE INFUSION - SIMPLE MED
1.0000 m[IU]/min | INTRAVENOUS | Status: DC
  Administered 2018-06-27: 2 m[IU]/min via INTRAVENOUS
  Filled 2018-06-27: qty 1000

## 2018-06-27 MED ORDER — PHENYLEPHRINE 40 MCG/ML (10ML) SYRINGE FOR IV PUSH (FOR BLOOD PRESSURE SUPPORT)
80.0000 ug | PREFILLED_SYRINGE | INTRAVENOUS | Status: DC | PRN
  Filled 2018-06-27: qty 10

## 2018-06-27 MED ORDER — TERBUTALINE SULFATE 1 MG/ML IJ SOLN
0.2500 mg | Freq: Once | INTRAMUSCULAR | Status: DC | PRN
  Filled 2018-06-27: qty 1

## 2018-06-27 MED ORDER — FENTANYL CITRATE (PF) 100 MCG/2ML IJ SOLN
50.0000 ug | INTRAMUSCULAR | Status: DC | PRN
  Administered 2018-06-27: 50 ug via INTRAVENOUS
  Filled 2018-06-27: qty 2

## 2018-06-27 MED ORDER — OXYTOCIN 40 UNITS IN NORMAL SALINE INFUSION - SIMPLE MED: 2.5000 [IU]/h | INTRAVENOUS | Status: DC

## 2018-06-27 MED ORDER — LACTATED RINGERS IV SOLN
500.0000 mL | Freq: Once | INTRAVENOUS | Status: AC
  Administered 2018-06-27: 500 mL via INTRAVENOUS

## 2018-06-27 MED ORDER — OXYCODONE-ACETAMINOPHEN 5-325 MG PO TABS: 2.0000 | ORAL_TABLET | ORAL | Status: DC | PRN

## 2018-06-27 MED ORDER — LACTATED RINGERS AMNIOINFUSION
INTRAVENOUS | Status: DC
  Administered 2018-06-27: via INTRAUTERINE
  Filled 2018-06-27 (×2): qty 1000

## 2018-06-27 MED ORDER — SOD CITRATE-CITRIC ACID 500-334 MG/5ML PO SOLN: 30.0000 mL | ORAL | Status: DC | PRN

## 2018-06-27 MED ORDER — LACTATED RINGERS IV SOLN
INTRAVENOUS | Status: DC
  Administered 2018-06-27 (×3): via INTRAVENOUS

## 2018-06-27 MED ORDER — LIDOCAINE HCL (PF) 1 % IJ SOLN
30.0000 mL | INTRAMUSCULAR | Status: DC | PRN
  Filled 2018-06-27: qty 30

## 2018-06-27 MED ORDER — OXYTOCIN BOLUS FROM INFUSION
500.0000 mL | Freq: Once | INTRAVENOUS | Status: AC
  Administered 2018-06-28: 500 mL via INTRAVENOUS

## 2018-06-27 MED ORDER — PHENYLEPHRINE 40 MCG/ML (10ML) SYRINGE FOR IV PUSH (FOR BLOOD PRESSURE SUPPORT)
80.0000 ug | PREFILLED_SYRINGE | INTRAVENOUS | Status: DC | PRN
  Filled 2018-06-27 (×2): qty 10

## 2018-06-27 MED ORDER — FENTANYL 2.5 MCG/ML BUPIVACAINE 1/10 % EPIDURAL INFUSION (WH - ANES)
14.0000 mL/h | INTRAMUSCULAR | Status: DC | PRN
  Administered 2018-06-27 (×2): 14 mL/h via EPIDURAL
  Filled 2018-06-27 (×2): qty 100

## 2018-06-27 MED ORDER — ONDANSETRON HCL 4 MG/2ML IJ SOLN: 4.0000 mg | Freq: Four times a day (QID) | INTRAMUSCULAR | Status: DC | PRN

## 2018-06-27 NOTE — Anesthesia Procedure Notes (Signed)
Epidural Patient location during procedure: OB Start time: 06/27/2018 3:19 PM End time: 06/27/2018 3:26 PM  Staffing Anesthesiologist: Leilani Able, MD Performed: anesthesiologist   Preanesthetic Checklist Completed: patient identified, site marked, surgical consent, pre-op evaluation, timeout performed, IV checked, risks and benefits discussed and monitors and equipment checked  Epidural Patient position: sitting Prep: site prepped and draped and DuraPrep Patient monitoring: continuous pulse ox and blood pressure Approach: midline Location: L3-L4 Injection technique: LOR air  Needle:  Needle type: Tuohy  Needle gauge: 17 G Needle length: 9 cm and 9 Needle insertion depth: 5 cm cm Catheter type: closed end flexible Catheter size: 19 Gauge Catheter at skin depth: 10 cm Test dose: negative and Other  Assessment Sensory level: T9 Events: blood not aspirated, injection not painful, no injection resistance, negative IV test and no paresthesia  Additional Notes Reason for block:procedure for pain

## 2018-06-27 NOTE — Progress Notes (Signed)
OB/GYN Faculty Practice: Labor Progress Note  Subjective: Feeling uncomfortable. Contractions seem to be every 10 minutes, states very uncomfortable.   Objective: BP (!) 105/58   Pulse 87   Temp 98.8 F (37.1 C) (Oral)   Resp 18   Ht 5' 1.5" (1.562 m)   Wt 53.2 kg   LMP 09/24/2017   SpO2 99%   BMI 21.80 kg/m  Gen: lying on side, uncomfortable appearing Dilation: 5 Effacement (%): 90 Station: -2 Presentation: Vertex Exam by:: Dr Earlene Plater  Assessment and Plan: 18 y.o. G2P0010 [redacted]w[redacted]d here for early labor.   Labor: Admitted in early labor and initially evaluated for ROM. Crist Fat was negative. On my exam, I palpate hair and sutures confirmed SROM. Also with bloody show and has changed from 3.5 to 5. Okay to get epidural when desired.  -- pain control: desires epidural, just received some IV fentanyl -- PPH Risk: low  Fetal Well-Being: EFW 6-7lbs by Leopold's. Cephalic by sutures.  -- Category I - continuous fetal monitoring  -- GBS negative    Kamrin Sibley S. Earlene Plater, DO OB/GYN Fellow, Faculty Practice  2:38 PM

## 2018-06-27 NOTE — Progress Notes (Signed)
LABOR PROGRESS NOTE  Victoria Frye is a 18 y.o. G2P0010 at [redacted]w[redacted]d admitted for early labor and reports of possible ROM.  Subjective: Patient is seen resting in bed with supportive family at bedside.   Objective: BP 117/69   Pulse (!) 103   Temp 99.3 F (37.4 C) (Oral)   Resp 17   Ht 5' 1.5" (1.562 m)   Wt 53.2 kg   LMP 09/24/2017   SpO2 100%   BMI 21.80 kg/m  or  Vitals:   06/27/18 2200 06/27/18 2230 06/27/18 2300 06/27/18 2330  BP: 118/83 106/75 106/86 117/69  Pulse: 91 83 90 (!) 103  Resp:    17  Temp:    99.3 F (37.4 C)  TempSrc:    Oral  SpO2:      Weight:      Height:       Dilation: 8 Effacement (%): 90 Cervical Position: Middle Station: 0 Presentation: Vertex Exam by:: Peggyann Shoals FHT: baseline rate 135, moderate varibility, 15x15acel, variabledecel  Labs: Lab Results  Component Value Date   WBC 11.8 (H) 06/27/2018   HGB 10.7 (L) 06/27/2018   HCT 33.4 (L) 06/27/2018   MCV 87.9 06/27/2018   PLT 210 06/27/2018    Patient Active Problem List   Diagnosis Date Noted  . Normal labor 06/27/2018  . Chlamydia trachomatis infection in mother during third trimester of pregnancy 06/07/2018  . Periumbilical abdominal pain during pregnancy 05/21/2018  . Anemia affecting pregnancy in third trimester 04/11/2018  . Supervision of normal first pregnancy, antepartum 12/19/2017    Assessment / Plan: 18 y.o. G2P0010 at [redacted]w[redacted]d here for early Labor and ROM.  Labor: Active, IUPC placed at 2320 Fetal Wellbeing:  Category 1 Pain Control:  Epidural Anticipated MOD:  Vaginal  Peggyann Shoals, DO [facility], PGY-1 06/27/2018 11:47 PM

## 2018-06-27 NOTE — Anesthesia Preprocedure Evaluation (Signed)
Anesthesia Evaluation  Patient identified by MRN, date of birth, ID band Patient awake    Reviewed: Allergy & Precautions, H&P , NPO status , Patient's Chart, lab work & pertinent test results  Airway Mallampati: I  TM Distance: >3 FB Neck ROM: full    Dental no notable dental hx. (+) Teeth Intact   Pulmonary neg pulmonary ROS,    Pulmonary exam normal breath sounds clear to auscultation       Cardiovascular negative cardio ROS Normal cardiovascular exam Rhythm:regular Rate:Normal     Neuro/Psych negative neurological ROS  negative psych ROS   GI/Hepatic negative GI ROS, Neg liver ROS,   Endo/Other  negative endocrine ROS  Renal/GU negative Renal ROS     Musculoskeletal   Abdominal Normal abdominal exam  (+)   Peds  Hematology  (+) Blood dyscrasia, anemia ,   Anesthesia Other Findings   Reproductive/Obstetrics (+) Pregnancy                             Anesthesia Physical Anesthesia Plan  ASA: II  Anesthesia Plan: Epidural   Post-op Pain Management:    Induction:   PONV Risk Score and Plan:   Airway Management Planned:   Additional Equipment:   Intra-op Plan:   Post-operative Plan:   Informed Consent: I have reviewed the patients History and Physical, chart, labs and discussed the procedure including the risks, benefits and alternatives for the proposed anesthesia with the patient or authorized representative who has indicated his/her understanding and acceptance.       Plan Discussed with:   Anesthesia Plan Comments:         Anesthesia Quick Evaluation  

## 2018-06-27 NOTE — MAU Note (Signed)
Been contracting for 2 days, has not been timing them, states getting closer and stronger. No bleeding.  Had some leaking of clear water at 0400, then her mucous plug, and more leaking.

## 2018-06-27 NOTE — Anesthesia Pain Management Evaluation Note (Signed)
  CRNA Pain Management Visit Note  Patient: Victoria Frye, 18 y.o., female  "Hello I am a member of the anesthesia team at [facility]. We have an anesthesia team available at all times to provide care throughout the hospital, including epidural management and anesthesia for C-section. I don't know your plan for the delivery whether it a natural birth, water birth, IV sedation, nitrous supplementation, doula or epidural, but we want to meet your pain goals."   1.Was your pain managed to your expectations on prior hospitalizations?   No prior hospitalizations  2.What is your expectation for pain management during this hospitalization?     Epidural  3.How can we help you reach that goal? ANMD has been contacted for epidural placement.  Record the patient's initial score and the patient's pain goal.   Pain: 10  Pain Goal: 0 [facility] wants you to be able to say your pain was always managed very well.  Derrich Gaby 06/27/2018

## 2018-06-27 NOTE — MAU Provider Note (Signed)
   First Provider Initiated Contact with Patient 06/27/18 1001       S: Ms. Timekia Upson is a 18 y.o. G2P0010 at [redacted]w[redacted]d  who presents to MAU today complaining of leaking of fluid since 0400. She endorses vaginal bleeding. She endorses contractions. She reports normal fetal movement.  Patient was 1.5cm yesterday.   O: BP 128/78 (BP Location: Left Arm)   Pulse 74   Temp 98.5 F (36.9 C) (Oral)   Resp 18   Wt 53.2 kg   LMP 09/24/2017   SpO2 99%   BMI 21.80 kg/m  GENERAL: Well-developed, well-nourished female in no acute distress.  HEAD: Normocephalic, atraumatic.  CHEST: Normal effort of breathing, regular heart rate ABDOMEN: Soft, nontender, gravid PELVIC: Normal external female genitalia. Vagina is pink and rugated. Cervix with normal contour, no lesions. Normal discharge.  no pooling. Copious thick tan discharge noted.   Cervical exam:  Dilation: 3 Effacement (%): 90 Station: 0 Presentation: Vertex Exam by:: H Shanae Luo CNM   Fetal Monitoring: Baseline: 125 Variability: moderate Accelerations: 15x15 Decelerations: none Contractions: q 5  No results found for this or any previous visit (from the past 24 hour(s)).   A: SIUP at [redacted]w[redacted]d  Membranes intact  Early labor  P: Labor team notified   Admit to labor    Patient with +GC on 1/9, treated and Neg off urine on 1/23. However, given copious tan discharge that appears consistent with GC/CT will recollect swab today. Consider treating based on clinical presentation.    Thressa Sheller DNP, CNM  06/27/18  10:23 AM

## 2018-06-27 NOTE — H&P (Signed)
OBSTETRIC ADMISSION HISTORY AND PHYSICAL  *Late entry, initially evaluated by physician assistant student and resident, but this should serve as H&P* Victoria Frye is a 18 y.o. female G2P0010 with IUP at 5177w5d by L/7 presenting for early labor and reports of possible ROM.   Reports fetal movement. States vaginal bleeding and copious discharge as well.   [facility]  Support person in labor: parents  Ultrasounds . Anatomy U/S: female fetus, normal anatomy scan, anterior placenta  Prenatal History/Complications: . Chlamydia in pregnancy - negative TOC about a week later . Anemia - HgB 10.7 on admission   Past Medical History: Past Medical History:  Diagnosis Date  . Anemia   . GERD (gastroesophageal reflux disease)   . Medical history non-contributory   . Miscarriage within last 12 months     Past Surgical History: Past Surgical History:  Procedure Laterality Date  . 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 DILATION AND EVACUATION N/A 07/08/2017   Procedure: DILATATION AND EVACUATION;  Surgeon: Adam PhenixArnold, James G, MD;  Location: [facility];  Service: Gynecology;  Laterality: N/A;    Obstetrical History: OB History    Gravida  2   Para      Term      Preterm      AB  1   Living        SAB  1   TAB      Ectopic      Multiple      Live Births              Social History: Social History   Socioeconomic History  . Marital status: Single    Spouse name: Not on file  . Number of children: Not on file  . Years of education: Not on file  . Highest education level: Not on file  Occupational History  . Not on file  Social Needs  . Financial resource strain: Not on file  . Food insecurity:    Worry: Not on file    Inability: Not on file  . Transportation needs:    Medical: Not on file    Non-medical: Not on file  Tobacco Use  . Smoking status: Never Smoker  . Smokeless tobacco: Never  Used  Substance and Sexual Activity  . Alcohol use: No  . Drug use: Not Currently    Types: Marijuana    Comment: not since pregnant. Per pt stopped 08/27/17  . Sexual activity: Yes    Birth control/protection: None    Comment: last IC-05-15-18  Lifestyle  . Physical activity:    Days per week: Not on file    Minutes per session: Not on file  . Stress: Not on file  Relationships  . Social connections:    Talks on phone: Not on file    Gets together: Not on file    Attends religious service: Not on file    Active member of club or organization: Not on file    Attends meetings of clubs or organizations: Not on file    Relationship status: Not on file  Other Topics Concern  . Not on file  Social History Narrative  . Not on file    Family History: History reviewed. No pertinent family history.  Allergies: Allergies  Allergen Reactions  . Banana Anaphylaxis and Itching  . Other Itching    Onions    Medications Prior to  Admission  Medication Sig Dispense Refill Last Dose  . acetaminophen (TYLENOL) 325 MG tablet Take 2 tablets (650 mg total) by mouth every 6 (six) hours as needed (pain).   Past Week at Unknown time  . Elastic Bandages & Supports (COMFORT FIT MATERNITY SUPP SM) MISC 1 Units by Does not apply route daily as needed. (Patient not taking: Reported on 06/05/2018) 1 each 0 Not Taking  . ferrous sulfate 325 (65 FE) MG tablet Take 1 tablet by mouth twice a day with OJ. 60 tablet 3 06/21/2018 at Unknown time  . Prenatal Vit-Fe Fumarate-FA (MULTIVITAMIN-PRENATAL) 27-0.8 MG TABS tablet Take 1 tablet by mouth daily at 12 noon.   Past Week at Unknown time     Review of Systems  All systems reviewed and negative except as stated in HPI  Blood pressure (!) 105/58, pulse 87, temperature 98.8 F (37.1 C), temperature source Oral, resp. rate 18, height 5' 1.5" (1.562 m), weight 53.2 kg, last menstrual period 09/24/2017, SpO2 99 %. General appearance: uncomfortable  appearing Lungs: no respiratory distress Heart: regular rate  Abdomen: soft, non-tender; gravid Pelvic: deferred Extremities: no LE edema  Presentation: cephalic Fetal monitoring: 130s/mod/+a/-d Uterine activity: irregular, every 8-10 minutes  Dilation: 5 Effacement (%): 90 Station: -2 Exam by:: Dr Earlene PlaterWallace  Prenatal labs: ABO, Rh: AB/Positive/-- (07/25 1023) Antibody: Negative (07/25 1023) Rubella: 9.07 (07/25 1023) RPR: Non Reactive (11/14 0839)  HBsAg: Negative (07/25 1023)  HIV: Non Reactive (11/14 0839)  GBS: Negative (01/09 0000)  Glucola: normal 2-hr Genetic screening:  Declined   Prenatal Transfer Tool  Maternal Diabetes: No Genetic Screening: Declined Maternal Ultrasounds/Referrals: Normal Fetal Ultrasounds or other Referrals:  None Maternal Substance Abuse:  No Significant Maternal Medications:  None Significant Maternal Lab Results: None  Results for orders placed or performed during the hospital encounter of 06/27/18 (from the past 24 hour(s))  Wet prep, genital   Collection Time: 06/27/18 11:12 AM  Result Value Ref Range   Yeast Wet Prep HPF POC NONE SEEN NONE SEEN   Trich, Wet Prep NONE SEEN NONE SEEN   Clue Cells Wet Prep HPF POC NONE SEEN NONE SEEN   WBC, Wet Prep HPF POC FEW (A) NONE SEEN   Sperm NONE SEEN   POCT fern test   Collection Time: 06/27/18 11:26 AM  Result Value Ref Range   POCT Fern Test Negative = intact amniotic membranes   CBC   Collection Time: 06/27/18  1:42 PM  Result Value Ref Range   WBC 11.8 (H) 4.0 - 10.5 K/uL   RBC 3.80 (L) 3.87 - 5.11 MIL/uL   Hemoglobin 10.7 (L) 12.0 - 15.0 g/dL   HCT 16.133.4 (L) 09.636.0 - 04.546.0 %   MCV 87.9 80.0 - 100.0 fL   MCH 28.2 26.0 - 34.0 pg   MCHC 32.0 30.0 - 36.0 g/dL   RDW 40.915.0 81.111.5 - 91.415.5 %   Platelets 210 150 - 400 K/uL   nRBC 0.0 0.0 - 0.2 %    Patient Active Problem List   Diagnosis Date Noted  . Normal labor 06/27/2018  . Chlamydia trachomatis infection in mother during third trimester  of pregnancy 06/07/2018  . Periumbilical abdominal pain during pregnancy 05/21/2018  . Anemia affecting pregnancy in third trimester 04/11/2018  . Supervision of normal first pregnancy, antepartum 12/19/2017    Assessment/Plan:  Victoria Frye is a 18 y.o. G2P0010 at 1935w5d here for early labor, possible ROM.   Labor: Early labor. Changed from 1 to 3.5 in last  24 hours with progressive effacement  -- pain control: wants epidural, will start with IV pain medication   Fetal Wellbeing: EFW 6lbs by Leopold's. Cephalic by prior RN checks.  -- GBS (negative) -- continuous fetal monitoring - category I   Postpartum Planning -- breast/PP IUD -- RI/[x] Tdap/ declined flu vaccine   Chanelle Hodsdon S. Earlene Plater, DO OB/GYN Fellow

## 2018-06-27 NOTE — Progress Notes (Signed)
OB/GYN Faculty Practice: Labor Progress Note  Subjective: Strip note. Plan of care discussed with RN  Objective: BP 112/79   Pulse 85   Temp 99.1 F (37.3 C) (Oral)   Resp 17   Ht 5' 1.5" (1.562 m)   Wt 53.2 kg   LMP 09/24/2017   SpO2 100%   BMI 21.80 kg/m  Gen: strip note Dilation: 6 Effacement (%): 90 Cervical Position: Middle Station: 0 Presentation: Vertex Exam by:: Karl Ito, rnc   Assessment and Plan: 18 y.o. G2P0010 [redacted]w[redacted]d here with early labor, SROM.   Labor: Minimal change on own. Decided to start pitocin to help with contraction frequency.  -- pain control: epidural now in place -- PPH Risk: low  Fetal Well-Being: EFW 6-7lbs by Leopold's. Cephalic by sutures.  -- Category II - continuous fetal monitoring - occasional variables, repositioning  -- GBS negative    Victoria Frye S. Earlene Plater, DO OB/GYN Fellow, Faculty Practice  7:43 PM

## 2018-06-28 ENCOUNTER — Encounter (HOSPITAL_COMMUNITY): Payer: Self-pay | Admitting: *Deleted

## 2018-06-28 DIAGNOSIS — Z3A39 39 weeks gestation of pregnancy: Secondary | ICD-10-CM

## 2018-06-28 DIAGNOSIS — Z30430 Encounter for insertion of intrauterine contraceptive device: Secondary | ICD-10-CM

## 2018-06-28 LAB — CORD BLOOD GAS (ARTERIAL)
Bicarbonate: 24.3 mmol/L (ref 20.0–28.0)
pCO2 cord blood (arterial): 47.2 mmHg (ref 42.0–56.0)
pH cord blood (arterial): 7.333 (ref 7.210–7.380)

## 2018-06-28 LAB — RPR: RPR Ser Ql: NONREACTIVE

## 2018-06-28 MED ORDER — IBUPROFEN 600 MG PO TABS
600.0000 mg | ORAL_TABLET | Freq: Four times a day (QID) | ORAL | Status: DC
  Administered 2018-06-28 – 2018-06-30 (×8): 600 mg via ORAL
  Filled 2018-06-28 (×9): qty 1

## 2018-06-28 MED ORDER — PRENATAL MULTIVITAMIN CH
1.0000 | ORAL_TABLET | Freq: Every day | ORAL | Status: DC
  Administered 2018-06-28 – 2018-06-30 (×3): 1 via ORAL
  Filled 2018-06-28 (×3): qty 1

## 2018-06-28 MED ORDER — SIMETHICONE 80 MG PO CHEW: 80.0000 mg | CHEWABLE_TABLET | ORAL | Status: DC | PRN

## 2018-06-28 MED ORDER — ZOLPIDEM TARTRATE 5 MG PO TABS: 5.0000 mg | ORAL_TABLET | Freq: Every evening | ORAL | Status: DC | PRN

## 2018-06-28 MED ORDER — GENTAMICIN SULFATE 40 MG/ML IJ SOLN
5.0000 mg/kg | Freq: Once | INTRAVENOUS | Status: DC
  Filled 2018-06-28: qty 6.75

## 2018-06-28 MED ORDER — ONDANSETRON HCL 4 MG/2ML IJ SOLN: 4.0000 mg | INTRAMUSCULAR | Status: DC | PRN

## 2018-06-28 MED ORDER — SODIUM CHLORIDE 0.9 % IV SOLN
2.0000 g | Freq: Four times a day (QID) | INTRAVENOUS | Status: DC
  Administered 2018-06-28: 2 g via INTRAVENOUS
  Filled 2018-06-28: qty 2
  Filled 2018-06-28: qty 2000

## 2018-06-28 MED ORDER — BENZOCAINE-MENTHOL 20-0.5 % EX AERO
1.0000 "application " | INHALATION_SPRAY | CUTANEOUS | Status: DC | PRN
  Administered 2018-06-30: 1 via TOPICAL
  Filled 2018-06-28 (×2): qty 56

## 2018-06-28 MED ORDER — ACETAMINOPHEN 325 MG PO TABS: 650.0000 mg | ORAL_TABLET | ORAL | Status: DC | PRN

## 2018-06-28 MED ORDER — TETANUS-DIPHTH-ACELL PERTUSSIS 5-2.5-18.5 LF-MCG/0.5 IM SUSP: 0.5000 mL | Freq: Once | INTRAMUSCULAR | Status: DC

## 2018-06-28 MED ORDER — OXYCODONE HCL 5 MG PO TABS: 5.0000 mg | ORAL_TABLET | ORAL | Status: DC | PRN

## 2018-06-28 MED ORDER — ACETAMINOPHEN 500 MG PO TABS
1000.0000 mg | ORAL_TABLET | Freq: Once | ORAL | Status: AC
  Administered 2018-06-28: 1000 mg via ORAL
  Filled 2018-06-28: qty 2

## 2018-06-28 MED ORDER — DIPHENHYDRAMINE HCL 25 MG PO CAPS: 25.0000 mg | ORAL_CAPSULE | Freq: Four times a day (QID) | ORAL | Status: DC | PRN

## 2018-06-28 MED ORDER — WITCH HAZEL-GLYCERIN EX PADS: 1.0000 "application " | MEDICATED_PAD | CUTANEOUS | Status: DC | PRN

## 2018-06-28 MED ORDER — ONDANSETRON HCL 4 MG PO TABS
4.0000 mg | ORAL_TABLET | ORAL | Status: DC | PRN
  Filled 2018-06-28: qty 1

## 2018-06-28 MED ORDER — SENNOSIDES-DOCUSATE SODIUM 8.6-50 MG PO TABS
2.0000 | ORAL_TABLET | ORAL | Status: DC
  Administered 2018-06-28 – 2018-06-29 (×2): 2 via ORAL
  Filled 2018-06-28 (×2): qty 2

## 2018-06-28 MED ORDER — DIBUCAINE 1 % RE OINT
1.0000 "application " | TOPICAL_OINTMENT | RECTAL | Status: DC | PRN
  Filled 2018-06-28: qty 28

## 2018-06-28 MED ORDER — COCONUT OIL OIL
1.0000 "application " | TOPICAL_OIL | Status: DC | PRN
  Filled 2018-06-28 (×2): qty 120

## 2018-06-28 NOTE — Progress Notes (Signed)
LABOR PROGRESS NOTE  Victoria Frye is a 18 y.o. G2P0010 at 3523w6d  admitted for early labor and possible ROM.  Subjective: Patient seen resting and relaxing in bed. Has a new fever of 100.4*F.  Objective: BP 114/71   Pulse 88   Temp (!) 100.4 F (38 C) (Oral)   Resp 17   Ht 5' 1.5" (1.562 m)   Wt 53.2 kg   LMP 09/24/2017   SpO2 100%   BMI 21.80 kg/m  or  Vitals:   06/28/18 0200 06/28/18 0230 06/28/18 0300 06/28/18 0330  BP: 129/75 109/60 122/73 114/71  Pulse: 98 88 92 88  Resp:      Temp:   (!) 100.4 F (38 C)   TempSrc:   Oral   SpO2:      Weight:      Height:       Dilation: Lip/rim Effacement (%): 100 Cervical Position: Middle Station: Plus 1 Presentation: Vertex Exam by:: Karl Itoiana Ansah-Mensah, rnc  FHT: baseline rate 150, moderate varibility, 15x15acel, variable decel  Labs: Lab Results  Component Value Date   WBC 11.8 (H) 06/27/2018   HGB 10.7 (L) 06/27/2018   HCT 33.4 (L) 06/27/2018   MCV 87.9 06/27/2018   PLT 210 06/27/2018    Patient Active Problem List   Diagnosis Date Noted  . Normal labor 06/27/2018  . Chlamydia trachomatis infection in mother during third trimester of pregnancy 06/07/2018  . Periumbilical abdominal pain during pregnancy 05/21/2018  . Anemia affecting pregnancy in third trimester 04/11/2018  . Supervision of normal first pregnancy, antepartum 12/19/2017    Assessment / Plan: 18 y.o. G2P0010 at 723w6d here for early labor with possible ROM.  Labor: active Fetal Wellbeing:  Category 1 Pain Control:  Epidural Anticipated MOD:  Vaginal New Fever: 100.4*F, tylenol 1,000mg  given, Amp/Gent ordered, unsure of ROM time  Peggyann ShoalsHannah Verlinda Slotnick, DO [facility], PGY-1 06/28/2018 3:42 AM

## 2018-06-28 NOTE — Progress Notes (Signed)
Pt to NICU for brief visit prior to [facility] transfer via wheelchair accompanied by L&D RN. Pt received updates from NICU RN regarding newborn status.   Arne Cleveland N 9:05 AM

## 2018-06-28 NOTE — Anesthesia Postprocedure Evaluation (Signed)
Anesthesia Post Note  Patient: Victoria Frye  Procedure(s) Performed: AN AD HOC LABOR EPIDURAL     Patient location during evaluation: Mother Baby Anesthesia Type: Epidural Level of consciousness: awake and alert Pain management: pain level controlled Vital Signs Assessment: post-procedure vital signs reviewed and stable Respiratory status: spontaneous breathing, nonlabored ventilation and respiratory function stable Cardiovascular status: stable Postop Assessment: no headache, no backache, epidural receding, no apparent nausea or vomiting, patient able to bend at knees, able to ambulate and adequate PO intake Anesthetic complications: no    Last Vitals:  Vitals:   06/28/18 1030 06/28/18 1430  BP: 105/77 111/76  Pulse: 82 78  Resp: 16 18  Temp: 37.3 C 36.8 C  SpO2: 100%     Last Pain:  Vitals:   06/28/18 1430  TempSrc: Oral  PainSc: 0-No pain   Pain Goal:                   Land O'LakesMalinova,Yuriel Lopezmartinez Hristova

## 2018-06-28 NOTE — Progress Notes (Signed)
Pharmacy Antibiotic Note  Victoria Frye is a 18 y.o. female admitted on 06/27/2018 in labor with possible ROM.   Patient with new onset fever, unknown time of rupture and suspected triple I.  Pharmacy has been consulted for gentamicin dosing.  Plan: gentamicin 5mg /kg IV x 1 as patient is probably going to deliver soon.  Will continue Q24 if MD wants to continue postpartum.    Height: 5' 1.5" (156.2 cm) Weight: 117 lb 4 oz (53.2 kg) IBW/kg (Calculated) : 48.95  Temp (24hrs), Avg:99.2 F (37.3 C), Min:98.5 F (36.9 C), Max:100.4 F (38 C)  Recent Labs  Lab 06/27/18 1342  WBC 11.8*    CrCl cannot be calculated (Patient's most recent lab result is older than the maximum 21 days allowed.).    Allergies  Allergen Reactions  . Banana Anaphylaxis and Itching  . Other Itching    Onions    Antimicrobials this admission: Ampicillin 2 gram 1/31  >>   Thank you for allowing pharmacy to be a part of this patient's care.  Loyola Mast 06/28/2018 7:27 AM

## 2018-06-28 NOTE — Lactation Note (Signed)
This note was copied from a baby's chart. Lactation Consultation Note  Patient Name: Victoria Frye Date: 06/28/2018 Reason for consult: Initial assessment;NICU baby;1st time breastfeeding;Term;Other (Comment)(DEBP was set up earlier today by the [facility] )  Baby is 11 hours old ,  Mom has been up to see the baby and mentioned the baby's breathing is better.  Mom voiced concerns she is getting more milk with hand expressing compared to pumping.  LC reassured her the pumping can be a slow process, and the consistent pumping around the  Clock will enhance the let down and the volume will gradually increase.  LC praised mom for her efforts hand expressing and pumping.  LC recommended for mom to hand express prior to pumping and after pumping.  Per  Mom was active with [facility] and missed and appt. LC reassured mom to call and leave  A message that baby is in NICU and she will need a DEBP loaner on Monday prior to D/C.  Mom receptive for the [facility] to Fax a form to [facility] for a DEBP referral.  LC encouraged Naps / and drinking plenty of water , and nutritious calories. Mother informed of post-discharge support and given phone number to the lactation department, including services for phone call assistance; out-patient appointments; and breastfeeding support group. List of other breastfeeding resources in the community given in the handout. Encouraged mother to call for problems or concerns related to breastfeeding.  LC will check on mom tomorrow.    Maternal Data Has patient been taught Hand Expression?: Yes(as LC entered room mom working on hand expressing with EBM yield ) Does the patient have breastfeeding experience prior to this delivery?: No  Feeding    LATCH Score                   Interventions Interventions: Breast feeding basics reviewed;DEBP  Lactation Tools Discussed/Used Tools: Pump Breast pump type: Double-Electric Breast Pump WIC Program: Yes Pump Review:  Milk Storage(was set up by the [facility] ) Initiated by:: by the [facility] RN    Consult Status Consult Status: Follow-up Date: 06/29/18 Follow-up type: In-patient    Victoria Frye 06/28/2018, 6:21 PM

## 2018-06-28 NOTE — Discharge Summary (Signed)
OB Discharge Summary     Patient Name: Victoria Frye DOB: 12/02/2000 MRN: [id number]  Date of admission: 06/27/2018 Delivering MD: Peggyann ShoalsANDERSON, HANNAH C   Date of discharge: 06/30/2018  Admitting diagnosis: labor Intrauterine pregnancy: 2860w6d     Secondary diagnosis:  Active Problems:   Anemia affecting pregnancy in third trimester   Chlamydia trachomatis infection in mother during third trimester of pregnancy   Normal labor  Additional problems: None     Discharge diagnosis: Term Pregnancy Delivered                                                                                                Post partum procedures:None  Augmentation: Pitocin  Complications: None, unsure of time of ROM  Hospital course:  Onset of Labor With Vaginal Delivery     18 y.o. yo G2P1011 at 4160w6d was admitted in Latent Labor on 06/27/2018. Patient had an uncomplicated labor course as follows:  Membrane Rupture Time/Date: 11:00 AM ,06/27/2018   Intrapartum Procedures: Episiotomy: None [1]                                         Lacerations:  None [1]  Patient had a delivery of a Viable infant. 06/28/2018  Information for the patient's newborn:  [name], Boy Evalie [[id number]][[id number]]  Delivery Method: Vaginal, Spontaneous(Filed from Delivery Summary)   Pateint had an uncomplicated postpartum course.  She is ambulating, tolerating a regular diet, passing flatus, and urinating well. Patient is discharged home in stable condition on 06/30/2018  Physical exam  Vitals:   06/28/18 0730 06/28/18 0747 06/28/18 0801 06/28/18 0815  BP: 114/61 111/73 125/81 117/77  Pulse: 75 71 65 74  Resp: 19 16 16 18   Temp: 100.3 F (37.9 C)   98.5 F (36.9 C)  TempSrc: Axillary   Oral  SpO2:      Weight:      Height:       General: alert and cooperative Lochia: appropriate Uterine Fundus: firm Incision: N/A DVT Evaluation: No evidence of DVT seen on physical exam. Labs: Lab Results  Component Value Date   WBC 11.8 (H)  06/27/2018   HGB 10.7 (L) 06/27/2018   HCT 33.4 (L) 06/27/2018   MCV 87.9 06/27/2018   PLT 210 06/27/2018   CMP Latest Ref Rng & Units 06/27/2017  Glucose 65 - 99 mg/dL 91  BUN 6 - 20 mg/dL 9  Creatinine 7.820.44 - 9.561.00 mg/dL 2.130.52  Sodium 086135 - 578145 mmol/L 140  Potassium 3.5 - 5.1 mmol/L 3.8  Chloride 101 - 111 mmol/L 107  CO2 22 - 32 mmol/L 26  Calcium 8.9 - 10.3 mg/dL 9.2  Total Protein 6.5 - 8.1 g/dL -  Total Bilirubin 0.3 - 1.2 mg/dL -  Alkaline Phos 38 - 469126 U/L -  AST 15 - 41 U/L -  ALT 14 - 54 U/L -    Discharge instruction: per After Visit Summary and "Baby and Me Booklet".  After visit meds:  Allergies as of  06/30/2018      Reactions   Banana Anaphylaxis, Itching   Other Itching   Onions      Medication List    TAKE these medications   acetaminophen 325 MG tablet Commonly known as:  TYLENOL Take 2 tablets (650 mg total) by mouth every 6 (six) hours as needed (pain).   COMFORT FIT MATERNITY SUPP SM Misc 1 Units by Does not apply route daily as needed.   ferrous sulfate 325 (65 FE) MG tablet Take 1 tablet by mouth twice a day with OJ.   ibuprofen 600 MG tablet Commonly known as:  ADVIL,MOTRIN Take 1 tablet (600 mg total) by mouth every 6 (six) hours.   multivitamin-prenatal 27-0.8 MG Tabs tablet Take 1 tablet by mouth daily at 12 noon.   senna-docusate 8.6-50 MG tablet Commonly known as:  Senokot-S Take 2 tablets by mouth daily.      Diet: routine diet  Activity: Advance as tolerated. Pelvic rest for 6 weeks.   Outpatient follow up:6 weeks Follow up Appt: Future Appointments  Date Time Provider Department Center  07/03/2018  8:10 AM Raelyn Moraawson, Rolitta, CNM [facility] None   Follow up Visit:No follow-ups on file.  Postpartum contraception: IUD Liletta placed postpartum.   Newborn Data: Live born female  Birth Weight: 6 lb 15.8 oz (3170 g) APGAR: 2, 4  Newborn Delivery   Birth date/time:  06/28/2018 06:33:00 Delivery type:  Vaginal, Spontaneous     Baby Feeding: Breast Disposition:NICU  Reva Boresanya S Vicky Schleich 07/03/2018 9:41 PM

## 2018-06-29 ENCOUNTER — Encounter (HOSPITAL_COMMUNITY): Payer: Self-pay | Admitting: *Deleted

## 2018-06-29 LAB — CBC
HCT: 29.5 % — ABNORMAL LOW (ref 36.0–46.0)
Hemoglobin: 9.4 g/dL — ABNORMAL LOW (ref 12.0–15.0)
MCH: 28.1 pg (ref 26.0–34.0)
MCHC: 31.9 g/dL (ref 30.0–36.0)
MCV: 88.1 fL (ref 80.0–100.0)
PLATELETS: 192 10*3/uL (ref 150–400)
RBC: 3.35 MIL/uL — AB (ref 3.87–5.11)
RDW: 15.1 % (ref 11.5–15.5)
WBC: 12.7 10*3/uL — ABNORMAL HIGH (ref 4.0–10.5)
nRBC: 0 % (ref 0.0–0.2)

## 2018-06-29 NOTE — Progress Notes (Signed)
Post Partum Day 1 Subjective: no complaints, up ad lib, voiding, tolerating PO and + flatus  Objective: Blood pressure 126/74, pulse 62, temperature 98.2 F (36.8 C), temperature source Oral, resp. rate 18, height 5' 1.5" (1.562 m), weight 53.2 kg, last menstrual period 09/24/2017, SpO2 100 %, unknown if currently breastfeeding.  Physical Exam:  General: alert, cooperative, appears stated age and no distress Lochia: appropriate Uterine Fundus: firm Incision: NA DVT Evaluation: No evidence of DVT seen on physical exam.  Recent Labs    06/27/18 1342 06/29/18 0612  HGB 10.7* 9.4*  HCT 33.4* 29.5*    Assessment/Plan: Plan for discharge tomorrow and Contraception PP IUD   LOS: 2 days   Victoria Frye 06/29/2018, 8:14 AM

## 2018-06-29 NOTE — Progress Notes (Signed)
Abdominal binder given to patient.

## 2018-06-29 NOTE — Lactation Note (Signed)
This note was copied from a baby's chart. Lactation Consultation Note  Patient Name: Boy Dionne AnoCieara Grondin ZOXWR'UToday's Date: 06/29/2018 Reason for consult: Follow-up assessment;Term;NICU baby;1st time breastfeeding;Other (Comment)(LC encouraged mom to increase pumping both breast )  Baby is 8331 hours old  Per mom has pumped x 2 since she was seen yesterday, expressed feeling discouraged due to only drops with the pump. More with hand expressing. 2-3 ml .  LC reassured mom that is normal and it takes time . LC encouraged mom to increasing pumping.. think if the pumping as a feeding telling her brain to make milk.  [facility]     Maternal Data Has patient been taught Hand Expression?: Yes  Feeding Feeding Type: Formula  LATCH Score                   Interventions Interventions: Breast feeding basics reviewed  Lactation Tools Discussed/Used Tools: Pump Breast pump type: Double-Electric Breast Pump WIC Program: Yes([facility] )   Consult Status Consult Status: Follow-up Date: 06/30/18 Follow-up type: In-patient    Matilde SprangMargaret Ann Enolia Koepke 06/29/2018, 2:31 PM

## 2018-06-30 LAB — GC/CHLAMYDIA PROBE AMP (~~LOC~~) NOT AT ARMC
Chlamydia: NEGATIVE
Neisseria Gonorrhea: NEGATIVE

## 2018-06-30 MED ORDER — SENNOSIDES-DOCUSATE SODIUM 8.6-50 MG PO TABS: 2.0000 | ORAL_TABLET | ORAL | 0 refills | Status: DC

## 2018-06-30 MED ORDER — IBUPROFEN 600 MG PO TABS: 600.0000 mg | ORAL_TABLET | Freq: Four times a day (QID) | ORAL | 0 refills | Status: DC

## 2018-06-30 NOTE — Lactation Note (Signed)
This note was copied from a baby's chart. Lactation Consultation Note  Patient Name: Boy Yaribel Piette XIPJA'S Date: 06/30/2018 Reason for consult: Follow-up assessment;Term;NICU baby;1st time breastfeeding;Other (Comment)(milk is in / [facility] encouraged mom to ask the NICU RN about latching her baby ) and having the NICU RN call LC.  As LC entered the room mom juts finished pumping and her milk is in both breast. Mom reports her breast were very full when she woke up. LC reviewed sore nipple and engorgement prevention and tx, Supply and demand and the importance of being consistent  With pumping around the clock - 8-12 times a day. Also Clarified the Engorgement tx in the Mother and Baby care booklet.  Mom seemed excited that she was getting more milk.  Mother informed of post-discharge support and given phone number to the lactation department, including services for phone call assistance; out-patient appointments; and breastfeeding support group. List of other breastfeeding resources in the community given in the handout. Encouraged mother to call for problems or concerns related to breastfeeding.   Maternal Data Has patient been taught Hand Expression?: Yes(mom has been consistent along with pumping )  Feeding Feeding Type: Formula Nipple Type: Slow - flow  LATCH Score                   Interventions    Lactation Tools Discussed/Used WIC Program: Yes Pump Review: Setup, frequency, and cleaning;Milk Storage(reviewed )   Consult Status Consult Status: PRN Date: (baby in NICU ) Follow-up type: Other (comment)(baby in NICU )    Matilde Sprang [facility] 06/30/2018, 9:23 AM

## 2018-07-03 ENCOUNTER — Encounter: Payer: 59 | Admitting: Obstetrics and Gynecology

## 2018-07-31 ENCOUNTER — Ambulatory Visit (INDEPENDENT_AMBULATORY_CARE_PROVIDER_SITE_OTHER): Payer: 59 | Admitting: Obstetrics and Gynecology

## 2018-07-31 ENCOUNTER — Other Ambulatory Visit: Payer: Self-pay

## 2018-07-31 ENCOUNTER — Encounter: Payer: Self-pay | Admitting: Obstetrics and Gynecology

## 2018-07-31 DIAGNOSIS — Z30013 Encounter for initial prescription of injectable contraceptive: Secondary | ICD-10-CM | POA: Diagnosis not present

## 2018-07-31 DIAGNOSIS — Z30432 Encounter for removal of intrauterine contraceptive device: Secondary | ICD-10-CM | POA: Diagnosis not present

## 2018-07-31 DIAGNOSIS — Z1389 Encounter for screening for other disorder: Secondary | ICD-10-CM | POA: Diagnosis not present

## 2018-07-31 MED ORDER — MEDROXYPROGESTERONE ACETATE 150 MG/ML IM SUSP
150.0000 mg | Freq: Once | INTRAMUSCULAR | Status: AC
  Administered 2018-07-31: 150 mg via INTRAMUSCULAR

## 2018-07-31 MED ORDER — MEDROXYPROGESTERONE ACETATE 150 MG/ML IM SUSP: 150.0000 mg | INTRAMUSCULAR | 0 refills | Status: DC

## 2018-07-31 NOTE — Progress Notes (Signed)
     Plan: Pt tolerated Depo injection. Depo given Right upper outer quadrant.  Next injection due Oct 16, 2018-October 30, 2018.  Reminder card given. Clovis Pu, RN

## 2018-07-31 NOTE — Progress Notes (Signed)
   Post Partum Exam  Victoria Frye is a 18 y.o. G62P1011 female who presents for a postpartum visit. She is 5 weeks postpartum following a spontaneous vaginal delivery. I have fully reviewed the prenatal and intrapartum course. The delivery was at [redacted]w[redacted]d gestational weeks.  Anesthesia: epidural. Postpartum course has been uncomplicated. Baby's course has been uncomplicated. Baby is feeding by bottle - Gerber Gentle. Bleeding staining only. Bowel function is normal. Bladder function is normal. Patient is not sexually active. Contraception method is IUD. Postpartum depression screening:neg  The following portions of the patient's history were reviewed and updated as appropriate: allergies, current medications, past family history, past medical history, past social history, past surgical history and problem list. Last pap smear done N/A and was N/A  Review of Systems Constitutional: negative Eyes: negative Ears, nose, mouth, throat, and face: negative Respiratory: negative Cardiovascular: negative Gastrointestinal: negative Genitourinary:negative Integument/breast: negative Hematologic/lymphatic: negative Musculoskeletal:negative Neurological: negative Behavioral/Psych: negative Endocrine: negative Allergic/Immunologic: negative    Objective:  Blood pressure 129/85, pulse 97, temperature 98.4 F (36.9 C), temperature source Oral, height 5' 1.5" (1.562 m), weight 102 lb 12.8 oz (46.6 kg), last menstrual period 09/24/2017, not currently breastfeeding.  General:  alert, cooperative and no distress   Breasts:  inspection negative, no nipple discharge or bleeding, no masses or nodularity palpable  Lungs: clear to auscultation bilaterally  Heart:  regular rate and rhythm, S1, S2 normal, no murmur, click, rub or gallop  Abdomen: soft, non-tender; bowel sounds normal; no masses,  no organomegaly   Vulva:  normal  Vagina: normal vagina  Cervix:  tip of IUD and strings visualized in cervical os -  Removal of Mirena IUD -- strings visualized in cervical os, strings grasped firmly with long straight Kelly clamp, strings and IUD removed intact without difficulty; patient tolerated well.   Corpus: normal size, contour, position, consistency, mobility, non-tender  Adnexa:  normal adnexa  Rectal Exam: Not performed.        Assessment:    Normal postpartum exam. Pap smear not done at today's visit.   Plan:   1. Contraception: Depo-Provera injections 2. Follow up in: 3 month or as needed.   Edd Arbour, SNM  I confirm that I have verified the information documented in the nurse midwife student's note and that I have also personally reperformed the history, physical exam and all medical decision making activities of this service and have verified that all service and findings are accurately documented in this student's note.    [name], CNM 08/02/2018 12:38 PM

## 2018-09-30 ENCOUNTER — Other Ambulatory Visit: Payer: Self-pay

## 2018-09-30 ENCOUNTER — Other Ambulatory Visit (HOSPITAL_COMMUNITY)
Admission: RE | Admit: 2018-09-30 | Discharge: 2018-09-30 | Disposition: A | Discharge status: Home / Self Care | Payer: 59 | Source: Ambulatory Visit | Attending: Obstetrics and Gynecology | Admitting: Obstetrics and Gynecology

## 2018-09-30 ENCOUNTER — Ambulatory Visit (INDEPENDENT_AMBULATORY_CARE_PROVIDER_SITE_OTHER): Payer: 59 | Admitting: *Deleted

## 2018-09-30 VITALS — BP 122/79 | HR 89 | Temp 98.3°F | Ht 61.5 in | Wt 98.8 lb

## 2018-09-30 DIAGNOSIS — Z113 Encounter for screening for infections with a predominantly sexual mode of transmission: Secondary | ICD-10-CM | POA: Insufficient documentation

## 2018-09-30 DIAGNOSIS — R300 Dysuria: Secondary | ICD-10-CM | POA: Diagnosis not present

## 2018-09-30 LAB — POCT URINALYSIS DIPSTICK (MANUAL)
Nitrite, UA: NEGATIVE
Poct Bilirubin: NEGATIVE
Poct Blood: 250 — AB
Poct Glucose: NORMAL mg/dL
Poct Ketones: NEGATIVE
Poct Urobilinogen: NORMAL mg/dL
Spec Grav, UA: 1.02 (ref 1.010–1.025)
pH, UA: 7.5 (ref 5.0–8.0)

## 2018-09-30 NOTE — Progress Notes (Signed)
    Virtual Visit via Telephone Note  I connected with Victoria Frye on 09/30/18 at  2:30 PM EDT by telephone and verified that I am speaking with the correct person using two identifiers.  Location:  [facility] Patient: Victoria Frye Provider: Clovis Pu, RN   I discussed the limitations, risks, security and privacy concerns of performing an evaluation and management service by telephone and the availability of in person appointments. I also discussed with the patient that there may be a patient responsible charge related to this service. The patient expressed understanding and agreed to proceed.   History of Present Illness: SUBJECTIVE: Victoria Frye is a 18 y.o. female who complains of urinary frequency, urgency and dysuria x 2 days, without flank pain, fever, chills, or abnormal vaginal discharge or bleeding.  Last sex 1 week ago, unprotected. Partner tested positive for Chlamydia a few months ago. Patient was given medication.   Observations/Objective: Appears well, in no apparent distress.  Vital signs are normal. Urine dipstick shows positive for RBC's, positive for protein and positive for leukocytes.    LMP: 09/26/2018   Assessment and Plan: Dysuria Treatment per orders.  Call or return to clinic prn if these symptoms worsen or fail to improve as anticipated.  Follow Up Instructions:   I discussed the assessment and treatment plan with the patient. The patient was provided an opportunity to ask questions and all were answered. The patient agreed with the plan and demonstrated an understanding of the instructions.   The patient was advised to call back or seek an in-person evaluation if the symptoms worsen or if the condition fails to improve as anticipated.  I provided 10 minutes of non-face-to-face time during this encounter.   Clovis Pu, RN

## 2018-10-01 LAB — URINE CYTOLOGY ANCILLARY ONLY
Chlamydia: POSITIVE — AB
Neisseria Gonorrhea: NEGATIVE
Trichomonas: NEGATIVE

## 2018-10-02 ENCOUNTER — Telehealth: Payer: Self-pay | Admitting: *Deleted

## 2018-10-02 DIAGNOSIS — R399 Unspecified symptoms and signs involving the genitourinary system: Secondary | ICD-10-CM

## 2018-10-02 DIAGNOSIS — A749 Chlamydial infection, unspecified: Secondary | ICD-10-CM

## 2018-10-02 LAB — URINE CULTURE

## 2018-10-02 LAB — URINE CYTOLOGY ANCILLARY ONLY: Candida vaginitis: NEGATIVE

## 2018-10-02 MED ORDER — SULFAMETHOXAZOLE-TRIMETHOPRIM 800-160 MG PO TABS: 1.0000 | ORAL_TABLET | Freq: Two times a day (BID) | ORAL | 0 refills | Status: AC

## 2018-10-02 MED ORDER — FLUCONAZOLE 150 MG PO TABS: 150.0000 mg | ORAL_TABLET | Freq: Once | ORAL | 0 refills | Status: DC

## 2018-10-02 MED ORDER — AZITHROMYCIN 500 MG PO TABS: 1000.0000 mg | ORAL_TABLET | Freq: Once | ORAL | 0 refills | Status: AC

## 2018-10-02 NOTE — Telephone Encounter (Signed)
Left voice message for patient to return nurse call regarding test results.  Martin, Tamika L, RN  

## 2018-10-03 ENCOUNTER — Telehealth: Payer: Self-pay | Admitting: *Deleted

## 2018-10-03 DIAGNOSIS — B9689 Other specified bacterial agents as the cause of diseases classified elsewhere: Secondary | ICD-10-CM

## 2018-10-03 MED ORDER — METRONIDAZOLE 500 MG PO TABS: 500.0000 mg | ORAL_TABLET | Freq: Two times a day (BID) | ORAL | 0 refills | Status: DC

## 2018-10-03 MED ORDER — FLUCONAZOLE 150 MG PO TABS: 150.0000 mg | ORAL_TABLET | Freq: Once | ORAL | 0 refills | Status: AC

## 2018-10-03 NOTE — Telephone Encounter (Signed)
-----   Message from [place], [place] sent at 10/03/2018  8:38 AM EDT ----- Treat for BV and send in Diflucan 150 mg x 1 for her to take when she has taken all of these abx.

## 2018-10-06 ENCOUNTER — Encounter: Payer: Self-pay | Admitting: General Practice

## 2018-10-15 ENCOUNTER — Inpatient Hospital Stay (HOSPITAL_COMMUNITY)
Admission: RE | Admit: 2018-10-15 | Discharge: 2018-10-17 | DRG: 885 | Disposition: A | Discharge status: Home / Self Care | Payer: 59 | Attending: Psychiatry | Admitting: Psychiatry

## 2018-10-15 DIAGNOSIS — F332 Major depressive disorder, recurrent severe without psychotic features: Secondary | ICD-10-CM | POA: Diagnosis present

## 2018-10-15 DIAGNOSIS — R45851 Suicidal ideations: Secondary | ICD-10-CM | POA: Diagnosis present

## 2018-10-15 DIAGNOSIS — Z79899 Other long term (current) drug therapy: Secondary | ICD-10-CM | POA: Diagnosis not present

## 2018-10-15 DIAGNOSIS — K219 Gastro-esophageal reflux disease without esophagitis: Secondary | ICD-10-CM | POA: Diagnosis present

## 2018-10-15 DIAGNOSIS — F411 Generalized anxiety disorder: Secondary | ICD-10-CM | POA: Diagnosis present

## 2018-10-15 DIAGNOSIS — Z793 Long term (current) use of hormonal contraceptives: Secondary | ICD-10-CM | POA: Diagnosis not present

## 2018-10-15 DIAGNOSIS — F329 Major depressive disorder, single episode, unspecified: Secondary | ICD-10-CM | POA: Diagnosis present

## 2018-10-15 DIAGNOSIS — Z91018 Allergy to other foods: Secondary | ICD-10-CM

## 2018-10-15 DIAGNOSIS — F1290 Cannabis use, unspecified, uncomplicated: Secondary | ICD-10-CM | POA: Diagnosis present

## 2018-10-15 DIAGNOSIS — Z915 Personal history of self-harm: Secondary | ICD-10-CM

## 2018-10-15 MED ORDER — LORAZEPAM 2 MG/ML IJ SOLN
INTRAMUSCULAR | Status: AC
  Administered 2018-10-15: 2 mg via INTRAMUSCULAR
  Filled 2018-10-15: qty 1

## 2018-10-15 MED ORDER — DIPHENHYDRAMINE HCL 50 MG/ML IJ SOLN
INTRAMUSCULAR | Status: AC
  Administered 2018-10-15: 50 mg via INTRAMUSCULAR
  Filled 2018-10-15: qty 1

## 2018-10-15 MED ORDER — ZIPRASIDONE MESYLATE 20 MG IM SOLR
INTRAMUSCULAR | Status: AC
  Administered 2018-10-15: 20 mg via INTRAMUSCULAR
  Filled 2018-10-15: qty 20
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
## 2018-10-16 ENCOUNTER — Other Ambulatory Visit: Payer: Self-pay

## 2018-10-16 ENCOUNTER — Encounter (HOSPITAL_COMMUNITY): Payer: Self-pay

## 2018-10-16 DIAGNOSIS — F332 Major depressive disorder, recurrent severe without psychotic features: Principal | ICD-10-CM | POA: Diagnosis present

## 2018-10-16 LAB — TSH: TSH: 0.729 u[IU]/mL (ref 0.350–4.500)

## 2018-10-16 MED ORDER — LORAZEPAM 1 MG PO TABS: 2.0000 mg | ORAL_TABLET | Freq: Once | ORAL | Status: AC

## 2018-10-16 MED ORDER — ZIPRASIDONE MESYLATE 20 MG IM SOLR: 20.0000 mg | Freq: Four times a day (QID) | INTRAMUSCULAR | Status: DC | PRN

## 2018-10-16 MED ORDER — CITALOPRAM HYDROBROMIDE 10 MG PO TABS
10.0000 mg | ORAL_TABLET | Freq: Every day | ORAL | Status: DC
  Administered 2018-10-16 – 2018-10-17 (×2): 10 mg via ORAL
  Filled 2018-10-16 (×3): qty 1

## 2018-10-16 MED ORDER — DIPHENHYDRAMINE HCL 50 MG PO CAPS: 50.0000 mg | ORAL_CAPSULE | Freq: Once | ORAL | Status: AC

## 2018-10-16 MED ORDER — FLUOXETINE HCL 20 MG PO CAPS: 20.0000 mg | ORAL_CAPSULE | Freq: Every day | ORAL | 2 refills | Status: DC

## 2018-10-16 MED ORDER — HYDROXYZINE HCL 25 MG PO TABS
25.0000 mg | ORAL_TABLET | Freq: Four times a day (QID) | ORAL | Status: DC | PRN
  Filled 2018-10-16: qty 2

## 2018-10-16 MED ORDER — ZIPRASIDONE MESYLATE 20 MG IM SOLR
20.0000 mg | Freq: Once | INTRAMUSCULAR | Status: AC
  Administered 2018-10-15: 20 mg via INTRAMUSCULAR

## 2018-10-16 MED ORDER — MAGNESIUM HYDROXIDE 400 MG/5ML PO SUSP: 30.0000 mL | Freq: Every day | ORAL | Status: DC | PRN

## 2018-10-16 MED ORDER — MIRTAZAPINE 15 MG PO TBDP
15.0000 mg | ORAL_TABLET | Freq: Every day | ORAL | Status: DC
  Filled 2018-10-16 (×3): qty 1

## 2018-10-16 MED ORDER — ACETAMINOPHEN 325 MG PO TABS: 650.0000 mg | ORAL_TABLET | Freq: Four times a day (QID) | ORAL | Status: DC | PRN

## 2018-10-16 MED ORDER — DIPHENHYDRAMINE HCL 50 MG/ML IJ SOLN
50.0000 mg | Freq: Once | INTRAMUSCULAR | Status: AC
  Administered 2018-10-15: 50 mg via INTRAMUSCULAR

## 2018-10-16 MED ORDER — OXCARBAZEPINE 150 MG PO TABS
75.0000 mg | ORAL_TABLET | Freq: Two times a day (BID) | ORAL | Status: DC
  Administered 2018-10-16 – 2018-10-17 (×2): 75 mg via ORAL
  Filled 2018-10-16 (×4): qty 0.5

## 2018-10-16 MED ORDER — LORAZEPAM 2 MG/ML IJ SOLN
2.0000 mg | Freq: Once | INTRAMUSCULAR | Status: AC
  Administered 2018-10-15: 2 mg via INTRAMUSCULAR

## 2018-10-16 MED ORDER — ALUM & MAG HYDROXIDE-SIMETH 200-200-20 MG/5ML PO SUSP: 30.0000 mL | ORAL | Status: DC | PRN

## 2018-10-16 NOTE — Progress Notes (Signed)
1:1 note  Pt awoke hungry: meals provided. Pt is calm, collected, sullen, sad, but hopeful for tomorrow. Pt denies any physical pain or symptoms. q71m safety checks implemented and continued. Medication provided per protocol and standing orders.  Pt safe on the unit. Sitter within arms reach. 1:1 continues.

## 2018-10-16 NOTE — Plan of Care (Signed)
1:1 note  Pt awoke and was asking for breakfast. Food provided. Pt is sullen, sad, and minimal in her interaction. Pt was told that her parents called, but stated she didn't want to speak to them. Pt denies si/hi/ah/vh and verbally agrees to approach staff if these become apparent or before harming herself/others while at [facility]. Pt went back to bed once moved form 500 hall to 400 hall. Pt resting now. Medications will be provided and educated on once awakening. Pt safe on the unit. Q38m safety checks implemented and continued. 1:1 within arms reach. Will continue to monitor.   Pt progressing in the following metrics  Problem: Education: Goal: Knowledge of [place] [facility] Education information/materials will improve Outcome: Progressing Goal: Emotional status will improve Outcome: Progressing Goal: Mental status will improve Outcome: Progressing Goal: Verbalization of understanding the information provided will improve Outcome: Progressing
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
## 2018-10-16 NOTE — Progress Notes (Signed)
1:1 note  Pt has been resting in the quiet room this morning. Pt doesn't seem in distress from unlabored symmetrical rising of her chest. Pt's 1:1 within arms reach. Pt safe on the unit. q76m safety checks implemented and continued. Will continue to monitor.

## 2018-10-16 NOTE — Progress Notes (Signed)
Nursing Progress Note: 7p-7a D: Pt currently presents with a sad/flat/depressed/blaming others affect and behavior. Pt states "I really shouldn't be here. I really overreacted yesterday." Not Interacting with the milieu. Pt reports good sleep during the previous night with current medication regimen. Pt did not attend wrap-up group.  A: Pt provided with medications per providers orders. Pt's labs and vitals were monitored throughout the night. Pt supported emotionally and encouraged to express concerns and questions. Pt educated on medications.  R: Pt's safety ensured with 15 minute and environmental checks. Pt currently denies SI, HI, and AVH. Pt verbally contracts to seek staff if SI,HI, or AVH occurs and to consult with staff before acting on any harmful thoughts. Will continue to monitor.

## 2018-10-16 NOTE — [facility] Counselor (Signed)
CSW attempted to engage patient to complete PSA. Patient is currently asleep. Patient is on a 1:1 due to behaviors presented during admission.   CSW will continue to follow and attempt PSA at a later time.   Baldo Daub, MSW, LCSWA Clinical Social Worker [facility]  Phone: [phone]

## 2018-10-16 NOTE — H&P (Signed)
Psychiatric Admission Assessment Adult  Patient Identification: Victoria Frye MRN:  [id number] Date of Evaluation:  10/16/2018 Chief Complaint:  MDD recurrent severe  Principal Diagnosis: <principal problem not specified> Diagnosis:  Active Problems:   MDD (major depressive disorder), recurrent episode, severe (HCC)  History of Present Illness: Patient is seen and examined.  Patient is a 18 year old female with an essentially negative past psychiatric history who presented to the behavioral health hospital directly with her mother.  The patient's mother brought the patient to the hospital secondary to the patient having suicidal ideation and thoughts about stabbing herself.  The patient was evaluated last p.m., and became significantly agitated after finding out she would have to stay at hospital.  She stated that "I was disposed to come here and talk to someone".  She then became significantly agitated and required intramuscular medications including Geodon.  She was placed in the quiet room.  She was able to calm herself, and after several hours was returned to the 400 hall for evaluation.  She stated that much of this was because of the relationship issues with her baby's father.  She stated that she no longer had contact with him, and the only time that he ever saw him was when he visited the child.  The patient stated she had tried to harm herself at least one time in the past.  She stated in 2014 she attempted to hang herself.  No one was aware of this and she was not hospitalized at that time.  She denied any previous psychiatric treatment in the past.  She admitted to thinking about stabbing herself today, and she also hit her head against a mirror but we did not break the glass.  Unfortunately she broke the mirror with her hand.  She admitted to use of marijuana anywhere between 1-3 times a week, and had had some alcohol.  She admitted to being upset, feeling helpless, hopeless and worthless.  She  was admitted to the hospital for evaluation and stabilization.  Associated Signs/Symptoms: Depression Symptoms:  depressed mood, anhedonia, insomnia, psychomotor agitation, fatigue, feelings of worthlessness/guilt, difficulty concentrating, hopelessness, suicidal thoughts without plan, suicidal attempt, anxiety, loss of energy/fatigue, disturbed sleep, (Hypo) Manic Symptoms:  Impulsivity, Irritable Mood, Labiality of Mood, Anxiety Symptoms:  Excessive Worry, Psychotic Symptoms:  denied PTSD Symptoms: Negative Total Time spent with patient: 30 minutes  Past Psychiatric History: Patient denied any previous psychiatric treatment, she denied any previous admissions, she denied any previous psychiatric medications.  Is the patient at risk to self? Yes.    Has the patient been a risk to self in the past 6 months? No.  Has the patient been a risk to self within the distant past? Yes.    Is the patient a risk to others? No.  Has the patient been a risk to others in the past 6 months? No.  Has the patient been a risk to others within the distant past? No.   Prior Inpatient Therapy: Prior Inpatient Therapy: No Prior Outpatient Therapy: Prior Outpatient Therapy: No Does patient have an ACCT team?: No Does patient have Intensive In-House Services?  : No Does patient have [facility] services? : No Does patient have [facility] services?: No  Alcohol Screening:   Substance Abuse History in the last 12 months:  Yes.   Consequences of Substance Abuse: Negative Previous Psychotropic Medications: No  Psychological Evaluations: No  Past Medical History:  Past Medical History:  Diagnosis Date  . Anemia   . GERD (gastroesophageal reflux  disease)   . Medical history non-contributory   . Miscarriage within last 12 months     Past Surgical History:  Procedure Laterality Date  . 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 DILATION AND EVACUATION N/A 07/08/2017    Procedure: DILATATION AND EVACUATION;  Surgeon: Adam Phenix, MD;  Location: [facility];  Service: Gynecology;  Laterality: N/A;   Family History: No family history on file. Family Psychiatric  History: She denied any family history of any psychiatric illness. Tobacco Screening:   Social History:  Social History   Substance and Sexual Activity  Alcohol Use No     Social History   Substance and Sexual Activity  Drug Use Not Currently  . Types: Marijuana   Comment: not since pregnant. Per pt stopped 08/27/17    Additional Social History: Marital status: Single    Pain Medications: None Prescriptions: None Over the Counter: None History of alcohol / drug use?: Yes Name of Substance 1: Marijuana 1 - Age of First Use: 18 years of age 34 - Amount (size/oz): Joint at a time 1 - Frequency: About 3 times a week 1 - Duration: ongoing 1 - Last Use / Amount: 05/15                  Allergies:   Allergies  Allergen Reactions  . Banana Anaphylaxis and Itching  . Other Itching    Onions   Lab Results: No results found for this or any previous visit (from the past 48 hour(s)).  Blood Alcohol level:  No results found for: [facility]  Metabolic Disorder Labs:  No results found for: HGBA1C, MPG No results found for: PROLACTIN No results found for: CHOL, TRIG, HDL, CHOLHDL, VLDL, LDLCALC  Current Medications: Current Facility-Administered Medications  Medication Dose Route Frequency Provider Last Rate Last Dose  . acetaminophen (TYLENOL) tablet 650 mg  650 mg Oral Q6H PRN Kerry Hough, PA-C      . alum & mag hydroxide-simeth (MAALOX/MYLANTA) 200-200-20 MG/5ML suspension 30 mL  30 mL Oral Q4H PRN Kerry Hough, PA-C      . hydrOXYzine (ATARAX/VISTARIL) tablet 25 mg  25 mg Oral Q6H PRN Donell Sievert E, PA-C      . magnesium hydroxide (MILK OF MAGNESIA) suspension 30 mL  30 mL Oral Daily PRN Kerry Hough, PA-C      . mirtazapine (REMERON SOL-TAB) disintegrating tablet 15 mg   15 mg Oral QHS Donell Sievert E, PA-C       PTA Medications: Medications Prior to Admission  Medication Sig Dispense Refill Last Dose  . ferrous sulfate 325 (65 FE) MG tablet Take 1 tablet by mouth twice a day with OJ. 60 tablet 3 Taking  . medroxyPROGESTERone (DEPO-PROVERA) 150 MG/ML injection Inject 1 mL (150 mg total) into the muscle every 3 (three) months. 1 mL 0 Taking  . Prenatal Vit-Fe Fumarate-FA (MULTIVITAMIN-PRENATAL) 27-0.8 MG TABS tablet Take 1 tablet by mouth daily at 12 noon.   Not Taking  . acetaminophen (TYLENOL) 325 MG tablet Take 2 tablets (650 mg total) by mouth every 6 (six) hours as needed (pain). (Patient not taking: Reported on 07/31/2018)   Not Taking at Unknown time  . Elastic Bandages & Supports (COMFORT FIT MATERNITY SUPP SM) MISC 1 Units by Does not apply route daily as needed. (Patient not taking: Reported on 06/05/2018) 1 each 0 Completed Course at Unknown time  . ibuprofen (ADVIL,MOTRIN) 600  MG tablet Take 1 tablet (600 mg total) by mouth every 6 (six) hours. (Patient not taking: Reported on 07/31/2018) 30 tablet 0 Not Taking at Unknown time  . metroNIDAZOLE (FLAGYL) 500 MG tablet Take 1 tablet (500 mg total) by mouth 2 (two) times daily. (Patient not taking: Reported on 10/16/2018) 14 tablet 0 Completed Course at Unknown time  . senna-docusate (SENOKOT-S) 8.6-50 MG tablet Take 2 tablets by mouth daily. (Patient not taking: Reported on 07/31/2018) 60 tablet 0 Not Taking at Unknown time    Musculoskeletal: Strength & Muscle Tone: within normal limits Gait & Station: normal Patient leans: N/A  Psychiatric Specialty Exam: Physical Exam  Nursing note and vitals reviewed. Constitutional: She is oriented to person, place, and time. She appears well-developed and well-nourished.  HENT:  Head: Normocephalic and atraumatic.  Respiratory: Effort normal.  Neurological: She is alert and oriented to person, place, and time.    ROS  Blood pressure 106/62, pulse (!) 102,  temperature 98.6 F (37 C), temperature source Oral, resp. rate 18, last menstrual period 09/26/2018, SpO2 100 %, not currently breastfeeding.There is no height or weight on file to calculate BMI.  General Appearance: Disheveled  Eye Contact:  Fair  Speech:  Normal Rate  Volume:  Decreased  Mood:  Depressed and Dysphoric  Affect:  Congruent  Thought Process:  Coherent and Descriptions of Associations: Circumstantial  Orientation:  Full (Time, Place, and Person)  Thought Content:  Logical  Suicidal Thoughts:  No  Homicidal Thoughts:  No  Memory:  Immediate;   Fair Recent;   Fair Remote;   Fair  Judgement:  Impaired  Insight:  Lacking  Psychomotor Activity:  Normal  Concentration:  Concentration: Fair and Attention Span: Fair  Recall:  [employer] of Knowledge:  Fair  Language:  Fair  Akathisia:  Negative  Handed:  Right  AIMS (if indicated):     Assets:  Desire for Improvement Resilience  ADL's:  Intact  Cognition:  WNL  Sleep:       Treatment Plan Summary: Daily contact with patient to assess and evaluate symptoms and progress in treatment, Medication management and Plan : Patient is seen and examined.  Patient is a 18 year old female with a probable past psychiatric history significant for major depression, generalized anxiety and possible borderline personality disorder.  She also admitted to use of cannabis.  She will be admitted to the hospital.  She will be integrated into the milieu.  She will be encouraged to attend groups.  She will be encouraged to work on her coping skills.  She will be started on Celexa 10 mg p.o. daily for anxiety and depression.  Given her impulse control problems I am going to start her on Trileptal 75 mg p.o. twice daily and titrate that.  We will monitor for response.  We will contact her family for collateral information regarding her behavior, and additional changes in her medications will be done at that time if necessary.  Observation  Level/Precautions:  1 to 1 15 minute checks  Laboratory:  Chemistry Profile  Psychotherapy:    Medications:    Consultations:    Discharge Concerns:    Estimated LOS:  Other:     Physician Treatment Plan for Primary Diagnosis: <principal problem not specified> Long Term Goal(s): Improvement in symptoms so as ready for discharge  Short Term Goals: Ability to identify changes in lifestyle to reduce recurrence of condition will improve, Ability to verbalize feelings will improve, Ability to disclose and discuss  suicidal ideas, Ability to demonstrate self-control will improve, Ability to identify and develop effective coping behaviors will improve, Ability to maintain clinical measurements within normal limits will improve and Ability to identify triggers associated with substance abuse/mental health issues will improve  Physician Treatment Plan for Secondary Diagnosis: Active Problems:   MDD (major depressive disorder), recurrent episode, severe (HCC)  Long Term Goal(s): Improvement in symptoms so as ready for discharge  Short Term Goals: Ability to identify changes in lifestyle to reduce recurrence of condition will improve, Ability to verbalize feelings will improve, Ability to disclose and discuss suicidal ideas, Ability to demonstrate self-control will improve, Ability to identify and develop effective coping behaviors will improve, Ability to maintain clinical measurements within normal limits will improve and Ability to identify triggers associated with substance abuse/mental health issues will improve  I certify that inpatient services furnished can reasonably be expected to improve the patient's condition.    Antonieta PertGreg Lawson Rashaun Curl, MD 5/21/202011:33 AM

## 2018-10-16 NOTE — Progress Notes (Addendum)
Pt was brought back to the unit from observation via the restraint chair at 0050 with 1:1 sitter. Pt sleeping and compliant with care when awakened. Pt was removed from restraints at 0052 and escorted to bed in quiet room. Patient unable to fill out paperwork and complete admission at this time due to heavy sedation. Shoes, phone, and cards were secured in locker. Hoodie strings were cut out of pt sweatshirt. Pt currently sleeping in bed. Pt shows no sign of distress or injury. Will continue to monitor.

## 2018-10-16 NOTE — B[facility] Counselor (Signed)
Patient was admitted to the adult unit on 10/15/2018 after being observed in the OBS unit  yesterday evening. The patient has been cleared for discharge today, 10/16/2018 by Dr. Jeannine Kitten, MD.   CSW unable to complete PSA prior to patient's discharge. Per Dr. Jeannine Kitten, the patient's mother does not have any questions or concerns regarding the patient's discharge.   CSW will follow for a safe discharge.      Baldo Daub, MSW, LCSWA Clinical Social Worker [facility]  Phone: [phone]

## 2018-10-16 NOTE — Progress Notes (Signed)
1:1 Progress Note D: Pt currently in the quiet room asleep. Patient appropriate to situation. Pt in no current distress.  A: Sitter is currently sitting in quiet room with pt. R: Pt remains safe on a 1:1 per MD orders.   

## 2018-10-16 NOTE — Progress Notes (Signed)
Psychoeducational Group Note  Date:  10/16/2018 Time:  2230  Group Topic/Focus:  Wrap-Up Group:   The focus of this group is to help patients review their daily goal of treatment and discuss progress on daily workbooks.  Participation Level: Did Not Attend  Participation Quality:  Not Applicable  Affect:  Not Applicable  Cognitive:  Not Applicable  Insight:  Not Applicable  Engagement in Group: Not Applicable  Additional Comments: Patient did not attend the evening Wrap-Up group.   Keyasia Jolliff S 10/16/2018, 10:30 PM

## 2018-10-16 NOTE — [facility] Suicide Risk Assessment (Signed)
[facility] Discharge Suicide Risk Assessment   Principal Problem:acute self-harm gesture Discharge Diagnoses: Active Problems:   MDD (major depressive disorder), recurrent episode, severe (HCC)   Total Time spent with patient: 45 minutes  Musculoskeletal: Strength & Muscle Tone: within normal limits Gait & Station: normal Patient leans: N/A  Psychiatric Specialty Exam: ROS  Blood pressure 106/62, pulse (!) 102, temperature 98.6 F (37 C), temperature source Oral, resp. rate 18, last menstrual period 09/26/2018, SpO2 100 %, not currently breastfeeding.There is no height or weight on file to calculate BMI.  General Appearance: Casual  Eye Contact::  Fair  Speech:  Clear and Coherent409  Volume:  Decreased  Mood:  Dysphoric  Affect:  Congruent  Thought Process:  Coherent  Orientation:  Full (Time, Place, and Person)  Thought Content: Denies suicidal thoughts plans or intent  Suicidal Thoughts:  No  Homicidal Thoughts:  No  Memory:  NA  Judgement:  Intact  Insight:  Fair  Psychomotor Activity:  Normal  Concentration:  Good  Recall:  Good  Fund of Knowledge:Good  Language: Good  Akathisia:  Negative  Handed:  Right  AIMS (if indicated):     Assets:  Communication Skills Desire for Improvement  Sleep:     Cognition: WNL  ADL's:  Intact   Mental Status Per Nursing Assessment::   On Admission:     Demographic Factors:  Unemployed  Loss Factors: Decrease in vocational status  Historical Factors: Impulsivity  Risk Reduction Factors:   Sense of responsibility to family and Religious beliefs about death  Continued Clinical Symptoms:  Postpartum Depression  Cognitive Features That Contribute To Risk:  Thought constriction (tunnel vision)    Suicide Risk:  Minimal: No identifiable suicidal ideation.  Patients presenting with no risk factors but with morbid ruminations; may be classified as minimal risk based on the severity of the depressive symptoms    Plan Of  Care/Follow-up recommendations:  Activity:  full  Kerianne Gurr, MD 10/16/2018, 11:20 AM

## 2018-10-16 NOTE — Progress Notes (Signed)
1:1 Progress Note D: Pt currently in the quiet room asleep. Patient appropriate to situation. Pt in no current distress.  A: Sitter is currently sitting in quiet room with pt. R: Pt remains safe on a 1:1 per MD orders.

## 2018-10-16 NOTE — Progress Notes (Addendum)
Verbal order for Geodon, Benadryl and Ativan obtained from [facility] PA and were administered while pt was in restraint chair.  1:1 initiated while in restraint chair with sitter by side.

## 2018-10-16 NOTE — Progress Notes (Signed)
Pt observed in observation beating on doors and screaming loudly at staff and mother. Pt verbally aggressive and did not respond to any verbal de-escalation. Police had pt in a hold at 2350 for an aggressive pass made at staff. Pt responded physically and would not comply. Restraint chair was initiated at 2353. Pt was still hostile, screaming loudly, and kicking. After restraint chair was in full use, medications were administered. Pt in no sign of distress. Pt to remain in observation until [facility] gives word to move pt.

## 2018-10-16 NOTE — Progress Notes (Signed)
[place] NOVEL CORONAVIRUS (COVID-19) DAILY CHECK-OFF SYMPTOMS - answer yes or no to each - every day NO YES  Have you had a fever in the past 24 hours?  . Fever (Temp > 37.80C / 100F) X   Have you had any of these symptoms in the past 24 hours? . New Cough .  Sore Throat  .  Shortness of Breath .  Difficulty Breathing .  Unexplained Body Aches   X   Have you had any one of these symptoms in the past 24 hours not related to allergies?   . Runny Nose .  Nasal Congestion .  Sneezing   X   If you have had runny nose, nasal congestion, sneezing in the past 24 hours, has it worsened?  X   EXPOSURES - check yes or no X   Have you traveled outside the state in the past 14 days?  X   Have you been in contact with someone with a confirmed diagnosis of COVID-19 or PUI in the past 14 days without wearing appropriate PPE?  X   Have you been living in the same home as a person with confirmed diagnosis of COVID-19 or a PUI (household contact)?    X   Have you been diagnosed with COVID-19?    X              What to do next: Answered NO to all: Answered YES to anything:   Proceed with unit schedule Follow the [facility] Inpatient Flowsheet.   

## 2018-10-17 MED ORDER — CITALOPRAM HYDROBROMIDE 10 MG PO TABS: 10.0000 mg | ORAL_TABLET | Freq: Every day | ORAL | 0 refills | Status: DC

## 2018-10-17 MED ORDER — OXCARBAZEPINE 150 MG PO TABS: 75.0000 mg | ORAL_TABLET | Freq: Two times a day (BID) | ORAL | 0 refills | Status: DC

## 2018-10-17 NOTE — Progress Notes (Signed)
  [facility] Discharge Plan :  Will you be returning to the same living situation after discharge:  Yes,  patient reports she is discharging home with her parents At discharge, do you have transportation home?: Yes,  patient reports her parents are picking her up  Do you have the ability to pay for your medications: Yes,  Medicaid, support from parents  Release of information consent forms completed and in the chart;  Patient's signature needed at discharge.  Patient to Follow up at: Follow-up Information    [facility] Follow up on 10/22/2018.   Why:  Hospital follow up appointment is Wednesday, 5/27 at 9:30a.  Appointment will be held over the phone and the provider will contact you.  Contact information: [address] [place] [address] [phone]           Next level of care provider has access to [facility] Link:yes  Safety Planning and Suicide Prevention discussed: Yes,  with the patient      Has patient been referred to the Quitline?: N/A patient is not a smoker  Patient has been referred for addiction treatment: N/A  Maeola Sarah, LCSWA 10/17/2018, 1:31 PM

## 2018-10-17 NOTE — Progress Notes (Signed)
1:1 Progress Note D: Pt currently asleep in bed. Patient appropriate to situation. Pt in no current distress.  A: Sitter is currently at bedside. R: Pt remains safe on a 1:1 per MD orders.   

## 2018-10-17 NOTE — Plan of Care (Signed)
Discharge note  Patient verbalizes readiness for discharge. Follow up plan explained, AVS, Transition record and SRA given. Prescriptions and teaching provided. Belongings returned and signed for. Suicide safety plan completed and signed. Patient verbalizes understanding. Patient denies SI/HI and assures this writer she will seek assistance should that change. Patient discharged to lobby where mother was waiting.  Problem: Education: Goal: Knowledge of [facility] Education information/materials will improve Outcome: Adequate for Discharge Goal: Emotional status will improve Outcome: Adequate for Discharge Goal: Mental status will improve Outcome: Adequate for Discharge Goal: Verbalization of understanding the information provided will improve Outcome: Adequate for Discharge   Problem: Activity: Goal: Interest or engagement in activities will improve Outcome: Adequate for Discharge Goal: Sleeping patterns will improve Outcome: Adequate for Discharge   Problem: Coping: Goal: Ability to verbalize frustrations and anger appropriately will improve Outcome: Adequate for Discharge Goal: Ability to demonstrate self-control will improve Outcome: Adequate for Discharge   Problem: Health Behavior/Discharge Planning: Goal: Identification of resources available to assist in meeting health care needs will improve Outcome: Adequate for Discharge Goal: Compliance with treatment plan for underlying cause of condition will improve Outcome: Adequate for Discharge   Problem: Physical Regulation: Goal: Ability to maintain clinical measurements within normal limits will improve Outcome: Adequate for Discharge   Problem: Safety: Goal: Periods of time without injury will increase Outcome: Adequate for Discharge   Problem: Education: Goal: Ability to state activities that reduce stress will improve Outcome: Adequate for Discharge   Problem: Coping: Goal: Ability to identify and develop  effective coping behavior will improve Outcome: Adequate for Discharge   Problem: Self-Concept: Goal: Ability to identify factors that promote anxiety will improve Outcome: Adequate for Discharge Goal: Level of anxiety will decrease Outcome: Adequate for Discharge Goal: Ability to modify response to factors that promote anxiety will improve Outcome: Adequate for Discharge   Problem: Education: Goal: Utilization of techniques to improve thought processes will improve Outcome: Adequate for Discharge Goal: Knowledge of the prescribed therapeutic regimen will improve Outcome: Adequate for Discharge   Problem: Activity: Goal: Interest or engagement in leisure activities will improve Outcome: Adequate for Discharge Goal: Imbalance in normal sleep/wake cycle will improve Outcome: Adequate for Discharge   Problem: Coping: Goal: Coping ability will improve Outcome: Adequate for Discharge Goal: Will verbalize feelings Outcome: Adequate for Discharge   Problem: Health Behavior/Discharge Planning: Goal: Ability to make decisions will improve Outcome: Adequate for Discharge Goal: Compliance with therapeutic regimen will improve Outcome: Adequate for Discharge   Problem: Role Relationship: Goal: Will demonstrate positive changes in social behaviors and relationships Outcome: Adequate for Discharge   Problem: Safety: Goal: Ability to disclose and discuss suicidal ideas will improve Outcome: Adequate for Discharge Goal: Ability to identify and utilize support systems that promote safety will improve Outcome: Adequate for Discharge   Problem: Self-Concept: Goal: Will verbalize positive feelings about self Outcome: Adequate for Discharge Goal: Level of anxiety will decrease Outcome: Adequate for Discharge   Problem: Education: Goal: Ability to make informed decisions regarding treatment will improve Outcome: Adequate for Discharge   Problem: Coping: Goal: Coping ability will  improve Outcome: Adequate for Discharge   Problem: Health Behavior/Discharge Planning: Goal: Identification of resources available to assist in meeting health care needs will improve Outcome: Adequate for Discharge   Problem: Medication: Goal: Compliance with prescribed medication regimen will improve Outcome: Adequate for Discharge   Problem: Self-Concept: Goal: Ability to disclose and discuss suicidal ideas will improve Outcome: Adequate for Discharge Goal: Will verbalize positive  feelings about self Outcome: Adequate for Discharge

## 2018-10-17 NOTE — Discharge Summary (Signed)
Physician Discharge Summary Note  Patient:  Victoria Frye is an 18 y.o., female MRN:  [id number] DOB:  08-21-2000 Patient phone:  [phone] (home)  Patient address:   [address] [place] [address],  Total Time spent with patient: 15 minutes  Date of Admission:  10/15/2018 Date of Discharge: 10/17/18  Reason for Admission:  suicidal ideation  Principal Problem: MDD (major depressive disorder), recurrent episode, severe (HCC) Discharge Diagnoses: Principal Problem:   MDD (major depressive disorder), recurrent episode, severe (HCC)   Past Psychiatric History: Per admission H&P: Patient denied any previous psychiatric treatment, she denied any previous admissions, she denied any previous psychiatric medications.  Past Medical History:  Past Medical History:  Diagnosis Date  . Anemia   . GERD (gastroesophageal reflux disease)   . Medical history non-contributory   . Miscarriage within last 12 months     Past Surgical History:  Procedure Laterality Date  . 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 DILATION AND EVACUATION N/A 07/08/2017   Procedure: DILATATION AND EVACUATION;  Surgeon: Adam Phenix, MD;  Location: [facility];  Service: Gynecology;  Laterality: N/A;   Family History: History reviewed. No pertinent family history. Family Psychiatric  History: Denies Social History:  Social History   Substance and Sexual Activity  Alcohol Use No     Social History   Substance and Sexual Activity  Drug Use Not Currently  . Types: Marijuana   Comment: not since pregnant. Per pt stopped 08/27/17    Social History   Socioeconomic History  . Marital status: Single    Spouse name: Not on file  . Number of children: Not on file  . Years of education: Not on file  . Highest education level: Not on file  Occupational History  . Not on file  Social Needs  . Financial resource strain: Not on file  . Food insecurity:    Worry: Not on file     Inability: Not on file  . Transportation needs:    Medical: Not on file    Non-medical: Not on file  Tobacco Use  . Smoking status: Never Smoker  . Smokeless tobacco: Never Used  Substance and Sexual Activity  . Alcohol use: No  . Drug use: Not Currently    Types: Marijuana    Comment: not since pregnant. Per pt stopped 08/27/17  . Sexual activity: Yes    Birth control/protection: None    Comment: last IC-05-15-18  Lifestyle  . Physical activity:    Days per week: Not on file    Minutes per session: Not on file  . Stress: Not on file  Relationships  . Social connections:    Talks on phone: Not on file    Gets together: Not on file    Attends religious service: Not on file    Active member of club or organization: Not on file    Attends meetings of clubs or organizations: Not on file    Relationship status: Not on file  Other Topics Concern  . Not on file  Social History Narrative  . Not on file    Hospital Course:  From admission H&P: Patient is a 18 year old female with an essentially negative past psychiatric history who presented to the behavioral health hospital directly with her mother.  The patient's mother brought the patient to the hospital secondary to the patient having suicidal ideation and thoughts about stabbing herself.  The patient  was evaluated last p.m., and became significantly agitated after finding out she would have to stay at hospital.  She stated that "I was disposed to come here and talk to someone".  She then became significantly agitated and required intramuscular medications including Geodon.  She was placed in the quiet room.  She was able to calm herself, and after several hours was returned to the 400 hall for evaluation.  She stated that much of this was because of the relationship issues with her baby's father.  She stated that she no longer had contact with him, and the only time that he ever saw him was when he visited the child.  The patient  stated she had tried to harm herself at least one time in the past.  She stated in 2014 she attempted to hang herself.  No one was aware of this and she was not hospitalized at that time.  She denied any previous psychiatric treatment in the past.  She admitted to thinking about stabbing herself today, and she also hit her head against a mirror but we did not break the glass.  Unfortunately she broke the mirror with her hand.  She admitted to use of marijuana anywhere between 1-3 times a week, and had had some alcohol.  She admitted to being upset, feeling helpless, hopeless and worthless.  She was admitted to the hospital for evaluation and stabilization.  Ms. Blacklock was admitted under IVC for suicidal ideation. She became agitated after finding out she was going to be committed and threatened to hurt staff. She received IM Geodon, Benadryl and Ativan, and slept overnight. She showed calm behavior for remainder of hospitalization. She was started on Celexa and Trileptal. She responded well to treatment with no adverse effects reported. She remained on the [facility] unit for 2 days. She stabilized with medication and therapy. She was discharged on the medications listed below. She has shown improvement with improved mood, affect, sleep, appetite, and interaction. She denies any SI/HI/AVH and contracts for safety. She agrees to follow up at [facility] (see below). Patient is provided with prescriptions for medications upon discharge. Her parents are picking her up for discharge home.  Physical Findings: AIMS: Facial and Oral Movements Muscles of Facial Expression: None, normal Lips and Perioral Area: None, normal Jaw: None, normal Tongue: None, normal,Extremity Movements Upper (arms, wrists, hands, fingers): None, normal Lower (legs, knees, ankles, toes): None, normal, Trunk Movements Neck, shoulders, hips: None, normal, Overall Severity Severity of abnormal movements (highest score from questions above): None,  normal Incapacitation due to abnormal movements: None, normal Patient's awareness of abnormal movements (rate only patient's report): No Awareness, Dental Status Current problems with teeth and/or dentures?: No Does patient usually wear dentures?: No  CIWA:    COWS:     Musculoskeletal: Strength & Muscle Tone: within normal limits Gait & Station: normal Patient leans: N/A  Psychiatric Specialty Exam: Physical Exam  Nursing note and vitals reviewed. Constitutional: She is oriented to person, place, and time. She appears well-developed and well-nourished.  Cardiovascular: Normal rate.  Respiratory: Effort normal.  Neurological: She is alert and oriented to person, place, and time.    Review of Systems  Constitutional: Negative.   Respiratory: Negative for cough and shortness of breath.   Cardiovascular: Negative for chest pain.  Gastrointestinal: Negative for nausea and vomiting.  Neurological: Negative for headaches.  Psychiatric/Behavioral: Positive for depression (stable on medication). Negative for hallucinations, substance abuse and suicidal ideas. The patient is not nervous/anxious and does  not have insomnia.     Blood pressure 106/62, pulse (!) 102, temperature 98.6 F (37 C), temperature source Oral, resp. rate 18, last menstrual period 09/26/2018, SpO2 100 %, not currently breastfeeding.There is no height or weight on file to calculate BMI.  See MD's discharge SRA        Has this patient used any form of tobacco in the last 30 days? (Cigarettes, Smokeless Tobacco, Cigars, and/or Pipes)  No  Blood Alcohol level:  No results found for: [facility]  Metabolic Disorder Labs:  No results found for: HGBA1C, MPG No results found for: PROLACTIN No results found for: CHOL, TRIG, HDL, CHOLHDL, VLDL, LDLCALC  See Psychiatric Specialty Exam and Suicide Risk Assessment completed by Attending Physician prior to discharge.  Discharge destination:  Home  Is patient on multiple  antipsychotic therapies at discharge:  No   Has Patient had three or more failed trials of antipsychotic monotherapy by history:  No  Recommended Plan for Multiple Antipsychotic Therapies: NA  Discharge Instructions    Discharge instructions   Complete by:  As directed    Patient is instructed to take all prescribed medications as recommended. Report any side effects or adverse reactions to your outpatient psychiatrist. Patient is instructed to abstain from alcohol and illegal drugs while on prescription medications. In the event of worsening symptoms, patient is instructed to call the crisis hotline, 911, or go to the nearest emergency department for evaluation and treatment.     Allergies as of 10/17/2018      Reactions   Banana Anaphylaxis, Itching   Other Itching   Onions      Medication List    STOP taking these medications   acetaminophen 325 MG tablet Commonly known as:  Tylenol   Comfort Fit Maternity Supp Sm Misc   ibuprofen 600 MG tablet Commonly known as:  ADVIL   medroxyPROGESTERone 150 MG/ML injection Commonly known as:  DEPO-PROVERA   metroNIDAZOLE 500 MG tablet Commonly known as:  FLAGYL   senna-docusate 8.6-50 MG tablet Commonly known as:  Senokot-S     TAKE these medications     Indication  citalopram 10 MG tablet Commonly known as:  CELEXA Take 1 tablet (10 mg total) by mouth daily. Start taking on:  Oct 18, 2018  Indication:  Depression   ferrous sulfate 325 (65 FE) MG tablet Take 1 tablet by mouth twice a day with OJ.  Indication:  Iron Deficiency   multivitamin-prenatal 27-0.8 MG Tabs tablet Take 1 tablet by mouth daily at 12 noon.  Indication:  LACTATION   OXcarbazepine 150 MG tablet Commonly known as:  TRILEPTAL Take 0.5 tablets (75 mg total) by mouth 2 (two) times daily.  Indication:  Mood stability      Follow-up Information    [facility] Follow up on 10/22/2018.   Why:  Hospital follow up appointment is Wednesday, 5/27 at 9:30a.   Appointment will be held over the phone and the provider will contact you.  Contact information: [address] [place] [address] [phone]           Follow-up recommendations: Activity as tolerated. Diet as recommended by primary care physician. Keep all scheduled follow-up appointments as recommended.  Comments:   Patient is instructed to take all prescribed medications as recommended. Report any side effects or adverse reactions to your outpatient psychiatrist. Patient is instructed to abstain from alcohol and illegal drugs while on prescription medications. In the event of worsening symptoms, patient is instructed to call  the crisis hotline, 911, or go to the nearest emergency department for evaluation and treatment.  Signed: Aldean BakerJanet E Anabelle Bungert, NP 10/17/2018, 12:58 PM

## 2018-10-17 NOTE — Progress Notes (Signed)
[facility] Post 1:1 Observation Documentation  For the first (8) hours following discontinuation of 1:1 precautions, a progress note entry by nursing staff should be documented at least every 2 hours, reflecting the patient's behavior, condition, mood, and conversation.  Use the progress notes for additional entries.  Time 1:1 discontinued:  0740  Patient's Behavior:  Pt is interacting with her roommate in her room.  Patient's Condition:  Pt safe on the unit. Pt is animated and assertive in her interaction  Patient's Conversation:  Pt denies any physical pain or symptoms. Pt states she didn't eat breakfast; not appetizing. Pt states she slept well. Pt excited to discharge.   Suszanne Conners Rumeal Cullipher 10/17/2018, 10:50 AM

## 2018-10-17 NOTE — [facility] Suicide Risk Assessment (Signed)
[facility] Discharge Suicide Risk Assessment   Principal Problem: <principal problem not specified> Discharge Diagnoses: Active Problems:   MDD (major depressive disorder), recurrent episode, severe (HCC)   Total Time spent with patient: 15 minutes  Musculoskeletal: Strength & Muscle Tone: within normal limits Gait & Station: normal Patient leans: N/A  Psychiatric Specialty Exam: Review of Systems  All other systems reviewed and are negative.   Blood pressure 106/62, pulse (!) 102, temperature 98.6 F (37 C), temperature source Oral, resp. rate 18, last menstrual period 09/26/2018, SpO2 100 %, not currently breastfeeding.There is no height or weight on file to calculate BMI.  General Appearance: Casual  Eye Contact::  Fair  Speech:  Normal Rate409  Volume:  Normal  Mood:  Euthymic  Affect:  Congruent  Thought Process:  Coherent and Descriptions of Associations: Intact  Orientation:  Full (Time, Place, and Person)  Thought Content:  Logical  Suicidal Thoughts:  No  Homicidal Thoughts:  No  Memory:  Immediate;   Fair Recent;   Fair Remote;   Fair  Judgement:  Intact  Insight:  Lacking  Psychomotor Activity:  Normal  Concentration:  Fair  Recall:  [employer] of Knowledge:Fair  Language: Good  Akathisia:  Negative  Handed:  Right  AIMS (if indicated):     Assets:  Desire for Improvement Resilience  Sleep:  Number of Hours: 6.75  Cognition: WNL  ADL's:  Intact   Mental Status Per Nursing Assessment::   On Admission:  Suicidal ideation indicated by patient, Suicide plan, Self-harm thoughts, Intention to act on suicide plan, Suicidal ideation indicated by others, Plan includes specific time, place, or method, Self-harm behaviors, Belief that plan would result in death  Demographic Factors:  Adolescent or young adult, Low socioeconomic status and Unemployed  Loss Factors: NA  Historical Factors: Impulsivity  Risk Reduction Factors:   Sense of responsibility to family  and Living with another person, especially a relative  Continued Clinical Symptoms:  Depression:   Impulsivity Personality Disorders:   Cluster B  Cognitive Features That Contribute To Risk:  None    Suicide Risk:  Minimal: No identifiable suicidal ideation.  Patients presenting with no risk factors but with morbid ruminations; may be classified as minimal risk based on the severity of the depressive symptoms    Plan Of Care/Follow-up recommendations:  Activity:  ad lib  Antonieta Pert, MD 10/17/2018, 7:29 AM

## 2018-10-17 NOTE — Progress Notes (Signed)
[facility] Post 1:1 Observation Documentation  For the first (8) hours following discontinuation of 1:1 precautions, a progress note entry by nursing staff should be documented at least every 2 hours, reflecting the patient's behavior, condition, mood, and conversation.  Use the progress notes for additional entries.  Time 1:1 discontinued:  0740  Patient's Behavior:  Pt resting in bed, asleep  Patient's Condition:  Pt not in distress. Pt's breathing unlabored and symmertical  Patient's Conversation:  Pt asleep  Raylene Miyamoto 10/17/2018, 1:27 PM

## 2018-10-17 NOTE — Tx Team (Signed)
Interdisciplinary Treatment and Diagnostic Plan Update  10/17/2018 Time of Session: 9:00am Victoria Frye MRN: [id number]  Principal Diagnosis: MDD (major depressive disorder), recurrent episode, severe (HCC)  Secondary Diagnoses: Principal Problem:   MDD (major depressive disorder), recurrent episode, severe (HCC)   Current Medications:  Current Facility-Administered Medications  Medication Dose Route Frequency Provider Last Rate Last Dose  . acetaminophen (TYLENOL) tablet 650 mg  650 mg Oral Q6H PRN Kerry Hough, PA-C      . alum & mag hydroxide-simeth (MAALOX/MYLANTA) 200-200-20 MG/5ML suspension 30 mL  30 mL Oral Q4H PRN Donell Sievert E, PA-C      . citalopram (CELEXA) tablet 10 mg  10 mg Oral Daily Antonieta Pert, MD   10 mg at 10/17/18 2355  . hydrOXYzine (ATARAX/VISTARIL) tablet 25 mg  25 mg Oral Q6H PRN Donell Sievert E, PA-C      . magnesium hydroxide (MILK OF MAGNESIA) suspension 30 mL  30 mL Oral Daily PRN Kerry Hough, PA-C      . OXcarbazepine (TRILEPTAL) tablet 75 mg  75 mg Oral BID Antonieta Pert, MD   75 mg at 10/17/18 7322  . ziprasidone (GEODON) injection 20 mg  20 mg Intramuscular Q6H PRN Antonieta Pert, MD       Current Outpatient Medications  Medication Sig Dispense Refill  . ferrous sulfate 325 (65 FE) MG tablet Take 1 tablet by mouth twice a day with OJ. 60 tablet 3  . Prenatal Vit-Fe Fumarate-FA (MULTIVITAMIN-PRENATAL) 27-0.8 MG TABS tablet Take 1 tablet by mouth daily at 12 noon.    Melene Muller ON 10/18/2018] citalopram (CELEXA) 10 MG tablet Take 1 tablet (10 mg total) by mouth daily. 30 tablet 0  . OXcarbazepine (TRILEPTAL) 150 MG tablet Take 0.5 tablets (75 mg total) by mouth 2 (two) times daily. 30 tablet 0   PTA Medications: No medications prior to admission.    Patient Stressors:    Patient Strengths:    Treatment Modalities: Medication Management, Group therapy, Case management,  1 to 1 session with clinician, Psychoeducation,  Recreational therapy.   Physician Treatment Plan for Primary Diagnosis: MDD (major depressive disorder), recurrent episode, severe (HCC) Long Term Goal(s): Improvement in symptoms so as ready for discharge Improvement in symptoms so as ready for discharge   Short Term Goals: Ability to identify changes in lifestyle to reduce recurrence of condition will improve Ability to verbalize feelings will improve Ability to disclose and discuss suicidal ideas Ability to demonstrate self-control will improve Ability to identify and develop effective coping behaviors will improve Ability to maintain clinical measurements within normal limits will improve Ability to identify triggers associated with substance abuse/mental health issues will improve Ability to identify changes in lifestyle to reduce recurrence of condition will improve Ability to verbalize feelings will improve Ability to disclose and discuss suicidal ideas Ability to demonstrate self-control will improve Ability to identify and develop effective coping behaviors will improve Ability to maintain clinical measurements within normal limits will improve Ability to identify triggers associated with substance abuse/mental health issues will improve  Medication Management: Evaluate patient's response, side effects, and tolerance of medication regimen.  Therapeutic Interventions: 1 to 1 sessions, Unit Group sessions and Medication administration.  Evaluation of Outcomes: Adequate for Discharge  Physician Treatment Plan for Secondary Diagnosis: Principal Problem:   MDD (major depressive disorder), recurrent episode, severe (HCC)  Long Term Goal(s): Improvement in symptoms so as ready for discharge Improvement in symptoms so as ready for discharge   Short  Term Goals: Ability to identify changes in lifestyle to reduce recurrence of condition will improve Ability to verbalize feelings will improve Ability to disclose and discuss suicidal  ideas Ability to demonstrate self-control will improve Ability to identify and develop effective coping behaviors will improve Ability to maintain clinical measurements within normal limits will improve Ability to identify triggers associated with substance abuse/mental health issues will improve Ability to identify changes in lifestyle to reduce recurrence of condition will improve Ability to verbalize feelings will improve Ability to disclose and discuss suicidal ideas Ability to demonstrate self-control will improve Ability to identify and develop effective coping behaviors will improve Ability to maintain clinical measurements within normal limits will improve Ability to identify triggers associated with substance abuse/mental health issues will improve     Medication Management: Evaluate patient's response, side effects, and tolerance of medication regimen.  Therapeutic Interventions: 1 to 1 sessions, Unit Group sessions and Medication administration.  Evaluation of Outcomes: Adequate for Discharge   RN Treatment Plan for Primary Diagnosis: MDD (major depressive disorder), recurrent episode, severe (HCC) Long Term Goal(s): Knowledge of disease and therapeutic regimen to maintain health will improve  Short Term Goals: Ability to verbalize feelings will improve, Ability to identify and develop effective coping behaviors will improve and Compliance with prescribed medications will improve  Medication Management: RN will administer medications as ordered by provider, will assess and evaluate patient's response and provide education to patient for prescribed medication. RN will report any adverse and/or side effects to prescribing provider.  Therapeutic Interventions: 1 on 1 counseling sessions, Psychoeducation, Medication administration, Evaluate responses to treatment, Monitor vital signs and CBGs as ordered, Perform/monitor CIWA, COWS, AIMS and Fall Risk screenings as ordered, Perform  wound care treatments as ordered.  Evaluation of Outcomes: Adequate for Discharge   LCSW Treatment Plan for Primary Diagnosis: MDD (major depressive disorder), recurrent episode, severe (HCC) Long Term Goal(s): Safe transition to appropriate next level of care at discharge, Engage patient in therapeutic group addressing interpersonal concerns.  Short Term Goals: Engage patient in aftercare planning with referrals and resources  Therapeutic Interventions: Assess for all discharge needs, 1 to 1 time with Social worker, Explore available resources and support systems, Assess for adequacy in community support network, Educate family and significant other(s) on suicide prevention, Complete Psychosocial Assessment, Interpersonal group therapy.  Evaluation of Outcomes: Adequate for Discharge   Progress in Treatment: Attending groups: No. Participating in groups: No. Taking medication as prescribed: Yes. Toleration medication: Yes. Family/Significant other contact made: No, will contact:  patient declined consents, SPE reviewed with patient Patient understands diagnosis: Yes. Discussing patient identified problems/goals with staff: Yes. Medical problems stabilized or resolved: Yes. Denies suicidal/homicidal ideation: Yes. Issues/concerns per patient self-inventory: No.  New problem(s) identified: No, Describe:  none  New Short Term/Long Term Goal(s):  medication management for mood stabilization; elimination of SI thoughts; development of comprehensive mental wellness/sobriety plan.  Patient Goals:  discharge  Discharge Plan or Barriers: Return home with family, [facility]  Reason for Continuation of Hospitalization: Depression  Estimated Length of Stay: discharge today  Attendees: Patient: 10/17/2018 3:31 PM  Physician:  10/17/2018 3:31 PM  Nursing:  10/17/2018 3:31 PM  RN Care Manager: 10/17/2018 3:31 PM  Social Worker: Enid Cutterharlotte Zaahir Pickney, [place] 10/17/2018 3:31 PM  Recreational  Therapist:  10/17/2018 3:31 PM  Other:  10/17/2018 3:31 PM  Other:  10/17/2018 3:31 PM  Other: 10/17/2018 3:31 PM    Scribe for Treatment Team: Darreld Mcleanharlotte C Rayaan Garguilo, LCSWA 10/17/2018 3:31 PM

## 2018-10-17 NOTE — Progress Notes (Signed)
[facility] Post 1:1 Observation Documentation  For the first (8) hours following discontinuation of 1:1 precautions, a progress note entry by nursing staff should be documented at least every 2 hours, reflecting the patient's behavior, condition, mood, and conversation.  Use the progress notes for additional entries.  Time 1:1 discontinued:  0740  Patient's Behavior:  Pt in bed interacting with peer.  Patient's Condition:  Pt doesn't seem in distress from animated affect and equal unlabored breathing.   Patient's Conversation:  Pt was taken to her locker to retrieve numbers from her phone. Pt denies any physical pain or symptoms. Pt denies si/hi/ah/vh and verbally agrees to approach staff if these become apparent or before harming herself/others while at [facility].  Suszanne Conners Dacia Capers 10/17/2018, 10:53 AM

## 2018-10-17 NOTE — [facility] Suicide Risk Assessment (Signed)
[facility] INPATIENT:  Family/Significant Other Suicide Prevention Education  Suicide Prevention Education:   SPE completed with patient, as patient refused to consent to family contact. SPI pamphlet provided to pt and pt was encouraged to share information with support network, ask questions, and talk about any concerns relating to SPE. Patient denies access to guns/firearms and verbalized understanding of information provided. Mobile Crisis information also provided to patient.  Corrinna Karapetyan, MSW, LCSWA Clinical Social Worker [facility]  Phone: [phone]  

## 2018-10-21 ENCOUNTER — Other Ambulatory Visit: Payer: Self-pay | Admitting: *Deleted

## 2018-10-21 ENCOUNTER — Ambulatory Visit: Payer: 59

## 2018-10-21 DIAGNOSIS — Z3042 Encounter for surveillance of injectable contraceptive: Secondary | ICD-10-CM

## 2018-10-21 MED ORDER — MEDROXYPROGESTERONE ACETATE 150 MG/ML IM SUSP: 150.0000 mg | INTRAMUSCULAR | 0 refills | Status: DC

## 2018-10-21 NOTE — Progress Notes (Signed)
Rx for Depo Provera sent to pharmacy.  Martin, Tamika L, RN  

## 2018-10-23 ENCOUNTER — Other Ambulatory Visit: Payer: Self-pay

## 2018-10-23 ENCOUNTER — Ambulatory Visit (INDEPENDENT_AMBULATORY_CARE_PROVIDER_SITE_OTHER): Payer: 59 | Admitting: *Deleted

## 2018-10-23 VITALS — BP 111/72 | HR 84 | Temp 98.9°F | Ht 61.5 in | Wt 96.0 lb

## 2018-10-23 DIAGNOSIS — Z3042 Encounter for surveillance of injectable contraceptive: Secondary | ICD-10-CM | POA: Diagnosis not present

## 2018-10-23 MED ORDER — MEDROXYPROGESTERONE ACETATE 150 MG/ML IM SUSP
150.0000 mg | Freq: Once | INTRAMUSCULAR | Status: AC
  Administered 2018-10-23: 150 mg via INTRAMUSCULAR

## 2018-10-23 NOTE — Progress Notes (Signed)
   Subjective:  Pt in for Depo Provera injection.    Objective: Need for Contraception  Assessment: Pt tolerated Depo injection. Depo given right upper outer quadrant.   Plan:   Next injection due August 13-27, 2020.  Reminder card given. Clovis Pu, RN

## 2018-12-02 ENCOUNTER — Other Ambulatory Visit: Payer: Self-pay

## 2018-12-02 ENCOUNTER — Emergency Department (HOSPITAL_COMMUNITY)
Admission: EM | Admit: 2018-12-02 | Discharge: 2018-12-02 | Disposition: A | Discharge status: Home / Self Care | Payer: 59 | Attending: Emergency Medicine | Admitting: Emergency Medicine

## 2018-12-02 ENCOUNTER — Encounter (HOSPITAL_COMMUNITY): Payer: Self-pay | Admitting: Emergency Medicine

## 2018-12-02 DIAGNOSIS — N939 Abnormal uterine and vaginal bleeding, unspecified: Secondary | ICD-10-CM | POA: Insufficient documentation

## 2018-12-02 DIAGNOSIS — Z79899 Other long term (current) drug therapy: Secondary | ICD-10-CM | POA: Insufficient documentation

## 2018-12-02 DIAGNOSIS — R531 Weakness: Secondary | ICD-10-CM | POA: Insufficient documentation

## 2018-12-02 LAB — CBC
HCT: 40.5 % (ref 36.0–46.0)
Hemoglobin: 12.7 g/dL (ref 12.0–15.0)
MCH: 26.6 pg (ref 26.0–34.0)
MCHC: 31.4 g/dL (ref 30.0–36.0)
MCV: 84.7 fL (ref 80.0–100.0)
Platelets: 282 10*3/uL (ref 150–400)
RBC: 4.78 MIL/uL (ref 3.87–5.11)
RDW: 13.1 % (ref 11.5–15.5)
WBC: 3.4 10*3/uL — ABNORMAL LOW (ref 4.0–10.5)
nRBC: 0 % (ref 0.0–0.2)

## 2018-12-02 LAB — BASIC METABOLIC PANEL
Anion gap: 8 (ref 5–15)
BUN: 12 mg/dL (ref 6–20)
CO2: 25 mmol/L (ref 22–32)
Calcium: 9.4 mg/dL (ref 8.9–10.3)
Chloride: 106 mmol/L (ref 98–111)
Creatinine, Ser: 0.67 mg/dL (ref 0.44–1.00)
GFR calc Af Amer: >60 mL/min (ref >60)
GFR calc non Af Amer: >60 mL/min (ref >60)
Glucose, Bld: 89 mg/dL (ref 70–99)
Potassium: 3.9 mmol/L (ref 3.5–5.1)
Sodium: 139 mmol/L (ref 135–145)

## 2018-12-02 LAB — I-STAT BETA HCG BLOOD, ED (MC, WL, AP ONLY): I-stat hCG, quantitative: <5 m[IU]/mL (ref <5)

## 2018-12-02 LAB — TSH: TSH: 1.391 u[IU]/mL (ref 0.350–4.500)

## 2018-12-02 NOTE — ED Triage Notes (Signed)
Pt arriving with generalized weakness x1 month. Pt reports she has been on birth control for approx 5 months and feels that may be causing increased weakness. Pt also reports decreased PO intake.

## 2018-12-02 NOTE — Discharge Instructions (Addendum)
Your hemoglobin was normal today at 12.7.  Your electrolytes and thyroid function tests were also normal.  Your pregnancy test was negative.  Please follow-up with your OB/GYN for your continued vaginal bleeding.

## 2018-12-02 NOTE — ED Provider Notes (Signed)
TIME SEEN: 4:14 AM  CHIEF COMPLAINT: weakness, dizziness  HPI: Patient is a 18 y.o. G2P2 presents to the emergency department with generalized weakness for the past month, worse over the last week and dizziness that started today.  Describes the dizziness as a lightheadedness but also with vertigo.  No fevers, cough, chest pain, shortness of breath, palpitations, numbness or focal weakness, vomiting, diarrhea, abdominal pain, vaginal discharge, dysuria.  States she had a baby at 39 weeks 6 days by spontaneous vaginal delivery in February.  After delivery she had a Mirena IUD placed.  States she was not happy with the Mirena IUD and had it removed in March.  On March 5 she had a Depo-Provera injection for birth control by her midwife Raelyn MoraRolitta Dawson.  States 3 days after her Depo-Provera injection she began having vaginal bleeding and has had vaginal bleeding every day since.  She states that the bleeding is constant and has not worsened.  She is not passing clots.  She has never had a blood transfusion.  She is on iron tablets.  It appears that she was given a second Depakote injection on Oct 23, 2018.  ROS: See HPI Constitutional: no fever  Eyes: no drainage  ENT: no runny nose   Cardiovascular:  no chest pain  Resp: no SOB  GI: no vomiting GU: no dysuria Integumentary: no rash  Allergy: no hives  Musculoskeletal: no leg swelling  Neurological: no slurred speech ROS otherwise negative  PAST MEDICAL HISTORY/PAST SURGICAL HISTORY:  Past Medical History:  Diagnosis Date  . Anemia   . GERD (gastroesophageal reflux disease)   . Medical history non-contributory   . Miscarriage within last 12 months     MEDICATIONS:  Prior to Admission medications   Medication Sig Start Date End Date Taking? Authorizing Provider  citalopram (CELEXA) 10 MG tablet Take 1 tablet (10 mg total) by mouth daily. 10/18/18   Aldean BakerSykes, Janet E, NP  ferrous sulfate 325 (65 FE) MG tablet Take 1 tablet by mouth twice a day  with OJ. 04/14/18   Raelyn Moraawson, Rolitta, CNM  medroxyPROGESTERone (DEPO-PROVERA) 150 MG/ML injection Inject 1 mL (150 mg total) into the muscle every 3 (three) months. 10/21/18   Raelyn Moraawson, Rolitta, CNM  OXcarbazepine (TRILEPTAL) 150 MG tablet Take 0.5 tablets (75 mg total) by mouth 2 (two) times daily. 10/17/18   Aldean BakerSykes, Janet E, NP  Prenatal Vit-Fe Fumarate-FA (MULTIVITAMIN-PRENATAL) 27-0.8 MG TABS tablet Take 1 tablet by mouth daily at 12 noon.    [provider]    ALLERGIES:  Allergies  Allergen Reactions  . Banana Anaphylaxis and Itching  . Other Itching    Onions    SOCIAL HISTORY:  Social History   Tobacco Use  . Smoking status: Never Smoker  . Smokeless tobacco: Never Used  Substance Use Topics  . Alcohol use: No    FAMILY HISTORY: History reviewed. No pertinent family history.  EXAM: BP 123/66 (BP Location: Left Arm)   Pulse 71   Temp 98.4 F (36.9 C) (Oral)   Resp 14   SpO2 97%  CONSTITUTIONAL: Alert and oriented and responds appropriately to questions. Well-appearing; well-nourished HEAD: Normocephalic EYES: Conjunctivae clear, pupils appear equal, EOMI, no conjunctival pallor ENT: normal nose; moist mucous membranes NECK: Supple, no meningismus, no nuchal rigidity, no LAD  CARD: RRR; S1 and S2 appreciated; no murmurs, no clicks, no rubs, no gallops RESP: Normal chest excursion without splinting or tachypnea; breath sounds clear and equal bilaterally; no wheezes, no rhonchi, no rales,  no hypoxia or respiratory distress, speaking full sentences ABD/GI: Normal bowel sounds; non-distended; soft, non-tender, no rebound, no guarding, no peritoneal signs, no hepatosplenomegaly BACK:  The back appears normal and is non-tender to palpation, there is no CVA tenderness EXT: Normal ROM in all joints; non-tender to palpation; no edema; normal capillary refill; no cyanosis, no calf tenderness or swelling    SKIN: Normal color for age and race; warm; no rash NEURO: Moves all  extremities equally, cranial nerves II through XII intact, normal speech, normal gait, normal sensation diffusely PSYCH: The patient's mood and manner are appropriate. Grooming and personal hygiene are appropriate.  MEDICAL DECISION MAKING: Patient here with generalized weakness and dizziness.  She is concerned that her blood counts could be low given continuous vaginal bleeding since March.  Abdominal exam is benign.  Denies fevers, dysuria, discharge.  Will check hemoglobin, electrolytes, TSH and pregnancy test.  She appears well-hydrated on exam and states she is eating and drinking well without vomiting or diarrhea.  I do not feel she needs IV fluids at this time.  No chest pain or shortness of breath.  No palpitations.  I do not think this is ACS, PE, dissection.  No focal neurologic deficits to suggest stroke.  ED PROGRESS: Patient's labs are unremarkable.  Her hemoglobin is 12.7.  Electrolytes within normal limits.  TSH normal.  Pregnancy test negative.  I feel she is safe to be discharged and follow-up with her OB/GYN as an outpatient.  I do not feel she needs emergent pelvic exam or pelvic imaging.  She has no abdominal pain, pelvic discomfort, discharge, fevers, vomiting.  Bleeding has been ongoing since Depo-Provera injection on March 5.  She does not need blood transfusion today.  She is hemodynamically stable and safe for discharge.   At this time, I do not feel there is any life-threatening condition present. I have reviewed and discussed all results (EKG, imaging, lab, urine as appropriate) and exam findings with patient/family. I have reviewed nursing notes and appropriate previous records.  I feel the patient is safe to be discharged home without further emergent workup and can continue workup as an outpatient as needed. Discussed usual and customary return precautions. Patient/family verbalize understanding and are comfortable with this plan.  Outpatient follow-up has been provided as  needed. All questions have been answered.      Ward, Delice Bison, DO 12/02/18 [phone]

## 2018-12-12 DIAGNOSIS — H5213 Myopia, bilateral: Secondary | ICD-10-CM | POA: Diagnosis not present

## 2019-01-01 DIAGNOSIS — H52223 Regular astigmatism, bilateral: Secondary | ICD-10-CM | POA: Diagnosis not present

## 2019-01-12 ENCOUNTER — Ambulatory Visit: Payer: 59

## 2019-01-13 ENCOUNTER — Ambulatory Visit: Payer: 59

## 2019-01-19 ENCOUNTER — Ambulatory Visit: Payer: 59

## 2019-01-25 ENCOUNTER — Other Ambulatory Visit: Payer: Self-pay

## 2019-01-25 DIAGNOSIS — Z79899 Other long term (current) drug therapy: Secondary | ICD-10-CM | POA: Insufficient documentation

## 2019-01-25 DIAGNOSIS — N3001 Acute cystitis with hematuria: Secondary | ICD-10-CM | POA: Insufficient documentation

## 2019-01-25 DIAGNOSIS — N939 Abnormal uterine and vaginal bleeding, unspecified: Secondary | ICD-10-CM | POA: Diagnosis not present

## 2019-01-25 NOTE — ED Triage Notes (Signed)
Pt reports that she has been vaginally spotting for about 5 months, but for the last 2 days she has been passing large clots. Reports intermittent cramping. A&Ox4. Pt receives the depo shot for birth control.

## 2019-01-26 ENCOUNTER — Emergency Department (HOSPITAL_COMMUNITY)
Admission: EM | Admit: 2019-01-26 | Discharge: 2019-01-26 | Disposition: A | Discharge status: Home / Self Care | Payer: 59 | Attending: Emergency Medicine | Admitting: Emergency Medicine

## 2019-01-26 DIAGNOSIS — N3001 Acute cystitis with hematuria: Secondary | ICD-10-CM

## 2019-01-26 DIAGNOSIS — N939 Abnormal uterine and vaginal bleeding, unspecified: Secondary | ICD-10-CM

## 2019-01-26 LAB — URINALYSIS, ROUTINE W REFLEX MICROSCOPIC
Bilirubin Urine: NEGATIVE
Glucose, UA: NEGATIVE mg/dL
Ketones, ur: 5 mg/dL — AB
Nitrite: NEGATIVE
Protein, ur: NEGATIVE mg/dL
Specific Gravity, Urine: 1.024 (ref 1.005–1.030)
WBC, UA: >50 WBC/hpf — ABNORMAL HIGH (ref 0–5)
pH: 7 (ref 5.0–8.0)

## 2019-01-26 LAB — CBC
HCT: 41.4 % (ref 36.0–46.0)
Hemoglobin: 12.9 g/dL (ref 12.0–15.0)
MCH: 26.3 pg (ref 26.0–34.0)
MCHC: 31.2 g/dL (ref 30.0–36.0)
MCV: 84.5 fL (ref 80.0–100.0)
Platelets: 290 10*3/uL (ref 150–400)
RBC: 4.9 MIL/uL (ref 3.87–5.11)
RDW: 13 % (ref 11.5–15.5)
WBC: 4 10*3/uL (ref 4.0–10.5)
nRBC: 0 % (ref 0.0–0.2)

## 2019-01-26 LAB — I-STAT BETA HCG BLOOD, ED (MC, WL, AP ONLY): I-stat hCG, quantitative: <5 m[IU]/mL (ref <5)

## 2019-01-26 LAB — WET PREP, GENITAL
Sperm: NONE SEEN
Trich, Wet Prep: NONE SEEN
Yeast Wet Prep HPF POC: NONE SEEN

## 2019-01-26 MED ORDER — STERILE WATER FOR INJECTION IJ SOLN
INTRAMUSCULAR | Status: AC
  Administered 2019-01-26: 03:00:00
  Filled 2019-01-26: qty 10

## 2019-01-26 MED ORDER — AZITHROMYCIN 250 MG PO TABS
1000.0000 mg | ORAL_TABLET | Freq: Once | ORAL | Status: AC
  Administered 2019-01-26: 03:00:00 1000 mg via ORAL
  Filled 2019-01-26: qty 4

## 2019-01-26 MED ORDER — CEPHALEXIN 500 MG PO CAPS: 500.0000 mg | ORAL_CAPSULE | Freq: Two times a day (BID) | ORAL | 0 refills | Status: DC

## 2019-01-26 MED ORDER — CEFTRIAXONE SODIUM 1 G IJ SOLR
1.0000 g | Freq: Once | INTRAMUSCULAR | Status: AC
  Administered 2019-01-26: 03:00:00 1 g via INTRAMUSCULAR
  Filled 2019-01-26: qty 10

## 2019-01-26 NOTE — ED Provider Notes (Signed)
[facility] DEPT Provider Note   CSN: [id number] Arrival date & time: 01/25/19  2017     History   Chief Complaint Chief Complaint  Patient presents with  . Vaginal Bleeding    HPI Victoria Frye is a 18 y.o. female.     Patient presents to the emergency department for evaluation of vaginal bleeding.  Patient reports that she has been experiencing spotting since she started Depo-Provera in March.  She has contacted her OB/GYN about this and was told that it was normal.  Yesterday, however, she started having bleeding like a normal menstrual period.  Today she has had even heavier bleeding with passage of clots.  She also reports that her urine has an odor to it over the last few days.  No dysuria, fever, flank pain, nausea or vomiting.     Past Medical History:  Diagnosis Date  . Anemia   . GERD (gastroesophageal reflux disease)   . Medical history non-contributory   . Miscarriage within last 12 months     Patient Active Problem List   Diagnosis Date Noted  . MDD (major depressive disorder), recurrent episode, severe ([place]) 10/16/2018  . Normal labor 06/27/2018  . Chlamydia trachomatis infection in mother during third trimester of pregnancy 06/07/2018  . Periumbilical abdominal pain during pregnancy 05/21/2018  . Anemia affecting pregnancy in third trimester 04/11/2018  . Supervision of normal first pregnancy, antepartum 12/19/2017    Past Surgical History:  Procedure Laterality Date  . 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 DILATION AND EVACUATION N/A 07/08/2017   Procedure: DILATATION AND EVACUATION;  Surgeon: Woodroe Mode, MD;  Location: [facility];  Service: Gynecology;  Laterality: N/A;     OB History    Gravida  2   Para  1   Term  1   Preterm      AB  1   Living  1     SAB  1   TAB      Ectopic      Multiple  0   Live Births  1            Home Medications    Prior to  Admission medications   Medication Sig Start Date End Date Taking? Authorizing Provider  ferrous sulfate 325 (65 FE) MG tablet Take 1 tablet by mouth twice a day with OJ. Patient taking differently: Take 325 mg by mouth 2 (two) times a day. Take 1 tablet by mouth twice a day with OJ. 04/14/18  Yes Laury Deep, CNM  medroxyPROGESTERone (DEPO-PROVERA) 150 MG/ML injection Inject 1 mL (150 mg total) into the muscle every 3 (three) months. 10/21/18  Yes Laury Deep, CNM  cephALEXin (KEFLEX) 500 MG capsule Take 1 capsule (500 mg total) by mouth 2 (two) times daily. 01/26/19   Orpah Greek, MD  citalopram (CELEXA) 10 MG tablet Take 1 tablet (10 mg total) by mouth daily. Patient not taking: Reported on 12/02/2018 10/18/18   Connye Burkitt, NP  OXcarbazepine (TRILEPTAL) 150 MG tablet Take 0.5 tablets (75 mg total) by mouth 2 (two) times daily. Patient not taking: Reported on 12/02/2018 10/17/18   Connye Burkitt, NP    Family History No family history on file.  Social History Social History   Tobacco Use  . Smoking status: Never Smoker  . Smokeless tobacco: Never Used  Substance Use Topics  . Alcohol use: No  .  Drug use: Not Currently    Types: Marijuana    Comment: not since pregnant. Per pt stopped 08/27/17     Allergies   Banana and Other   Review of Systems Review of Systems  Genitourinary: Positive for vaginal bleeding.  All other systems reviewed and are negative.    Physical Exam Updated Vital Signs BP 118/67 (BP Location: Left Arm)   Pulse 80   Temp 99.6 F (37.6 C) (Oral)   Resp 16   Wt 42.5 kg   SpO2 97%   BMI 17.69 kg/m   Physical Exam Vitals signs and nursing note reviewed.  Constitutional:      General: She is not in acute distress.    Appearance: Normal appearance. She is well-developed.  HENT:     Head: Normocephalic and atraumatic.     Right Ear: Hearing normal.     Left Ear: Hearing normal.     Nose: Nose normal.  Eyes:      Conjunctiva/sclera: Conjunctivae normal.     Pupils: Pupils are equal, round, and reactive to light.  Neck:     Musculoskeletal: Normal range of motion and neck supple.  Cardiovascular:     Rate and Rhythm: Regular rhythm.     Heart sounds: S1 normal and S2 normal. No murmur. No friction rub. No gallop.   Pulmonary:     Effort: Pulmonary effort is normal. No respiratory distress.     Breath sounds: Normal breath sounds.  Chest:     Chest wall: No tenderness.  Abdominal:     General: Bowel sounds are normal.     Palpations: Abdomen is soft.     Tenderness: There is no abdominal tenderness. There is no guarding or rebound. Negative signs include Murphy's sign and McBurney's sign.     Hernia: No hernia is present.  Genitourinary:    Cervix: Normal.     Uterus: Normal.      Adnexa: Right adnexa normal.     Rectum: Normal.  Musculoskeletal: Normal range of motion.  Skin:    General: Skin is warm and dry.     Findings: No rash.  Neurological:     Mental Status: She is alert and oriented to person, place, and time.     GCS: GCS eye subscore is 4. GCS verbal subscore is 5. GCS motor subscore is 6.     Cranial Nerves: No cranial nerve deficit.     Sensory: No sensory deficit.     Coordination: Coordination normal.  Psychiatric:        Speech: Speech normal.        Behavior: Behavior normal.        Thought Content: Thought content normal.      ED Treatments / Results  Labs (all labs ordered are listed, but only abnormal results are displayed) Labs Reviewed  URINALYSIS, ROUTINE W REFLEX MICROSCOPIC - Abnormal; Notable for the following components:      Result Value   APPearance HAZY (*)    Hgb urine dipstick LARGE (*)    Ketones, ur 5 (*)    Leukocytes,Ua MODERATE (*)    WBC, UA >50 (*)    Bacteria, UA MANY (*)    All other components within normal limits  WET PREP, GENITAL  URINE CULTURE  CBC  I-STAT BETA HCG BLOOD, ED ([facility], [facility], [facility] ONLY)  GC/CHLAMYDIA PROBE AMP ([facility]) NOT AT [facility]    EKG None  Radiology No results found.  Procedures Procedures (including critical  care time)  Medications Ordered in ED Medications  cefTRIAXone (ROCEPHIN) injection 1 g (has no administration in time range)  azithromycin (ZITHROMAX) tablet 1,000 mg (has no administration in time range)     Initial Impression / Assessment and Plan / ED Course  I have reviewed the triage vital signs and the nursing notes.  Pertinent labs & imaging results that were available during my care of the patient were reviewed by me and considered in my medical decision making (see chart for details).        Patient presents to the emergency department for evaluation of vaginal bleeding.  She has had spotting since she started Depo-Provera almost 6 months ago.  She is due for her third shot but does not think she is going to get it.  Over the last 2 days she has had bleeding that was like a period and then heavier with some clots.  Currently cervix is closed and she is not actively bleeding.  She is likely experiencing bleeding secondary to withdrawal of the Depo-Provera.  She is not anemic and her vital signs are normal.  She does report an odor to her urine.  There is greater than 50 white cells with bacteria present.  Will culture.  Treated here with Rocephin and Zithromax to cover for possible STD, GC and chlamydia pending.  She did not have any significant cervical motion tenderness, however.  Continue outpatient Keflex.  Follow-up with OB/GYN.  Final Clinical Impressions(s) / ED Diagnoses   Final diagnoses:  Abnormal uterine bleeding (AUB)  Acute cystitis with hematuria    ED Discharge Orders         Ordered    cephALEXin (KEFLEX) 500 MG capsule  2 times daily     01/26/19 0202           Gilda Crease, MD 01/26/19 0202

## 2019-01-27 LAB — GC/CHLAMYDIA PROBE AMP (~~LOC~~) NOT AT ARMC
Chlamydia: NEGATIVE
Neisseria Gonorrhea: NEGATIVE

## 2019-01-28 LAB — URINE CULTURE: Culture: >=100000 — AB

## 2019-01-29 ENCOUNTER — Telehealth: Payer: Self-pay | Admitting: *Deleted

## 2019-01-29 NOTE — Telephone Encounter (Signed)
Post ED Visit - Positive Culture Follow-up  Culture report reviewed by antimicrobial stewardship pharmacist: [place] Team []  Elenor Quinones, Pharm.D. []  Heide Guile, Pharm.D., BCPS AQ-ID []  Parks Neptune, Pharm.D., BCPS []  Alycia Rossetti, Pharm.D., BCPS []  [place], [place].D., BCPS, AAHIVP []  Legrand Como, Pharm.D., BCPS, AAHIVP []  Salome Arnt, PharmD, BCPS []  Johnnette Gourd, PharmD, BCPS []  Hughes Better, PharmD, BCPS []  Leeroy Cha, PharmD []  Laqueta Linden, PharmD, BCPS []  Albertina Parr, PharmD  [place] Team []  Leodis Sias, PharmD []  Lindell Spar, PharmD []  Royetta Asal, PharmD []  Graylin Shiver, Rph []  Rema Fendt) Glennon Mac, PharmD []  Arlyn Dunning, PharmD []  Netta Cedars, PharmD [x]  Dia Sitter, PharmD []  Leone Haven, PharmD []  Gretta Arab, PharmD []  Theodis Shove, PharmD []  Peggyann Juba, PharmD []  Reuel Boom, PharmD   Positive urine culture Treated with Cephalexin, organism sensitive to the same and no further patient follow-up is required at this time.  Harlon Flor [facility] 01/29/2019, 2:51 PM

## 2019-07-06 ENCOUNTER — Encounter: Payer: Self-pay | Admitting: General Practice

## 2019-07-20 IMAGING — US US OB TRANSVAGINAL
1 series · 15 of 26 positions shown · non-contrast
Comparison: 04/21/2017

CLINICAL DATA: Pregnancy of unknown anatomic location.

EXAM:
TRANSVAGINAL OB ULTRASOUND
TECHNIQUE: Transvaginal ultrasound was performed for complete evaluation of the
gestation as well as the maternal uterus, adnexal regions, and
pelvic cul-de-sac.

[Series 1: us ob transvaginal · 26 acquisitions, 15 frames shown]
[im 1/26]
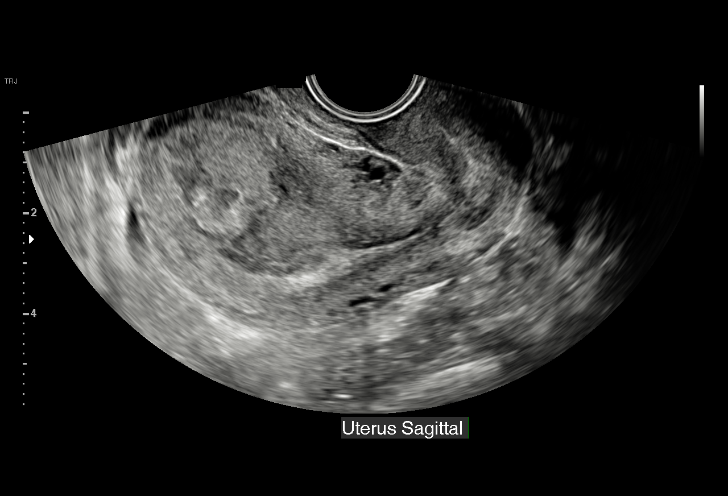
[im 3/26]
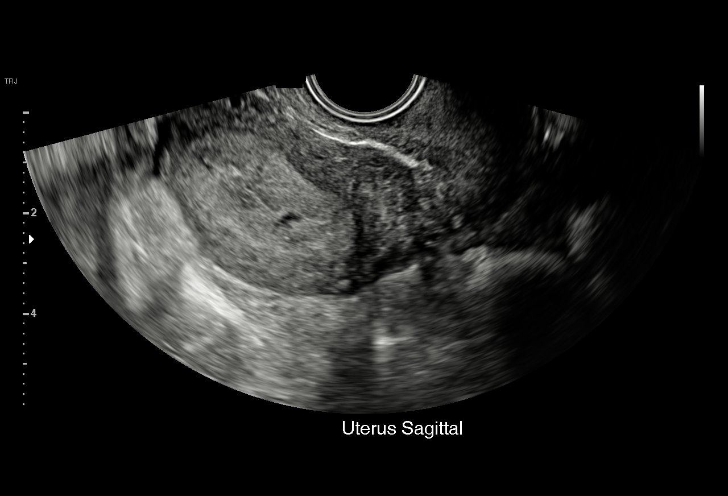
[im 5/26]
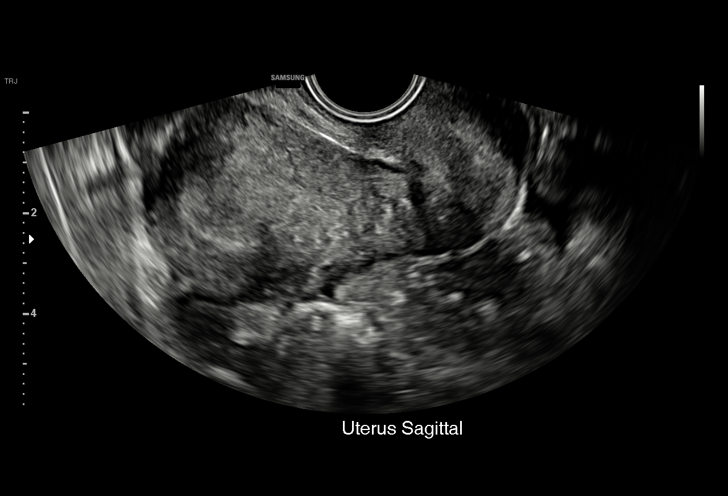
[im 7/26]
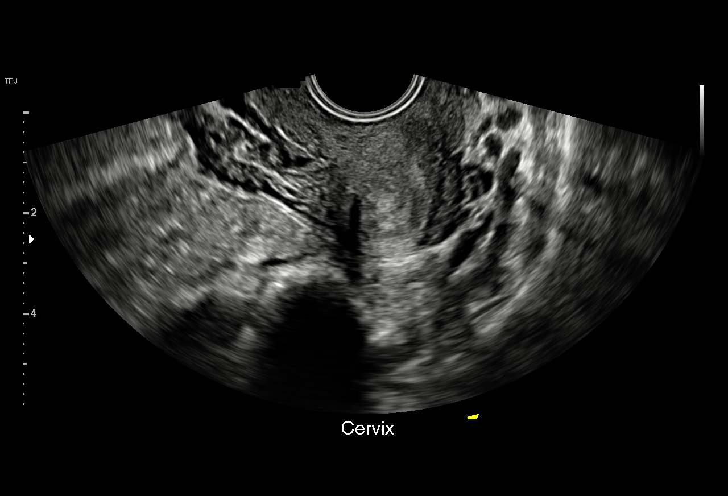
[im 8/26]
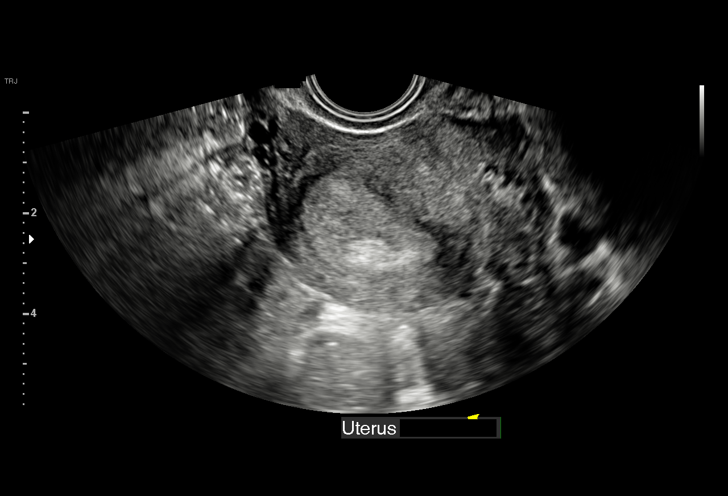
[im 10/26]
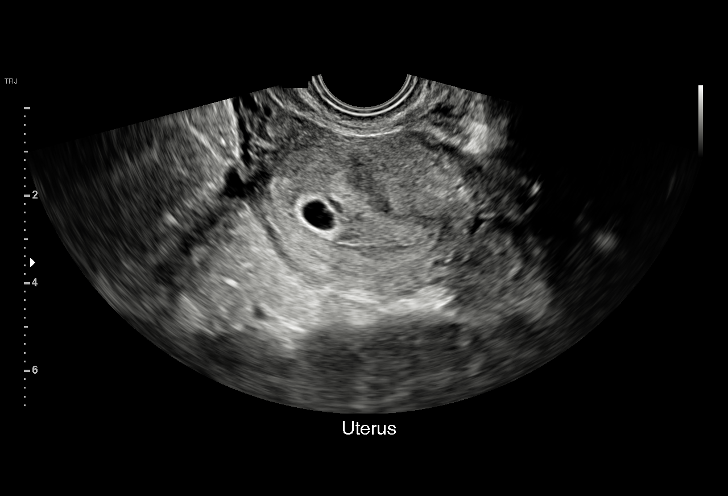
[im 12/26]
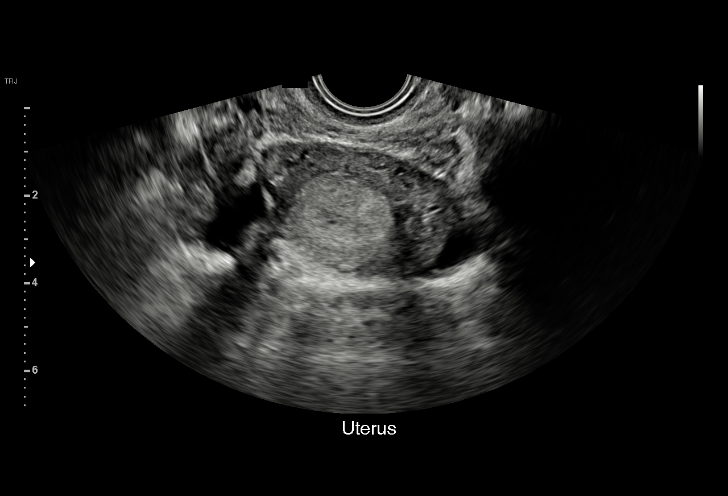
[im 14/26]
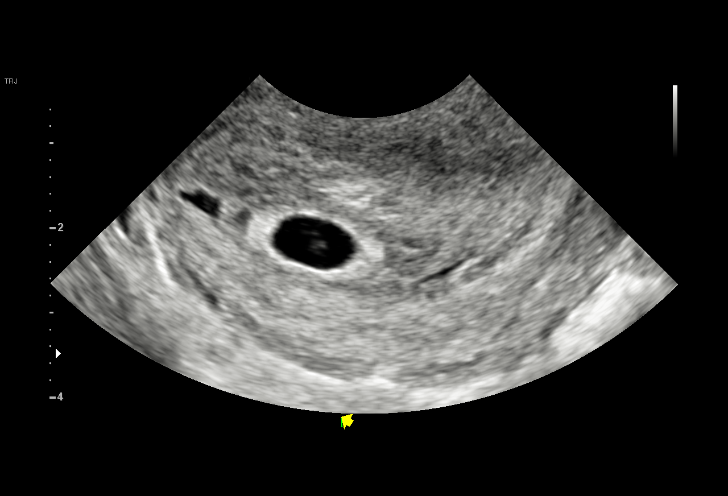
[im 15/26]
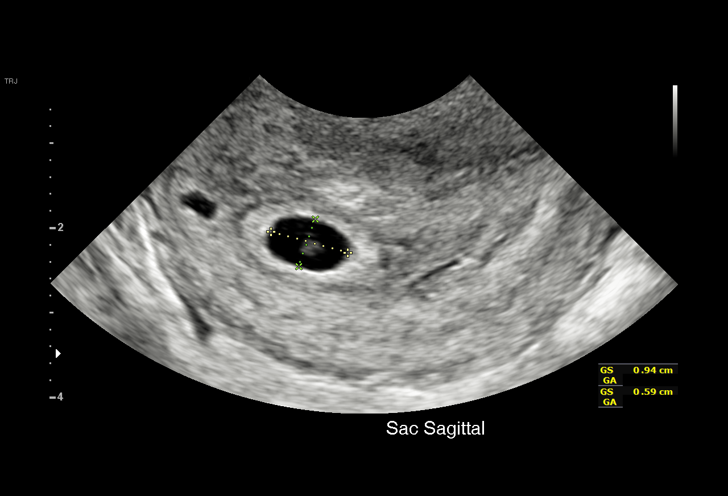
[im 17/26]
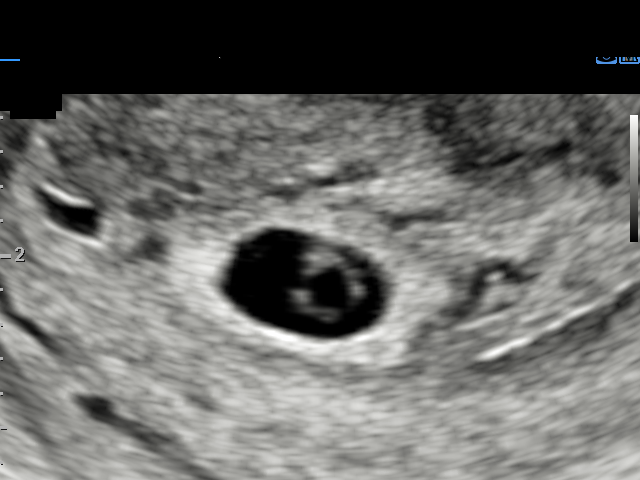
[im 19/26]
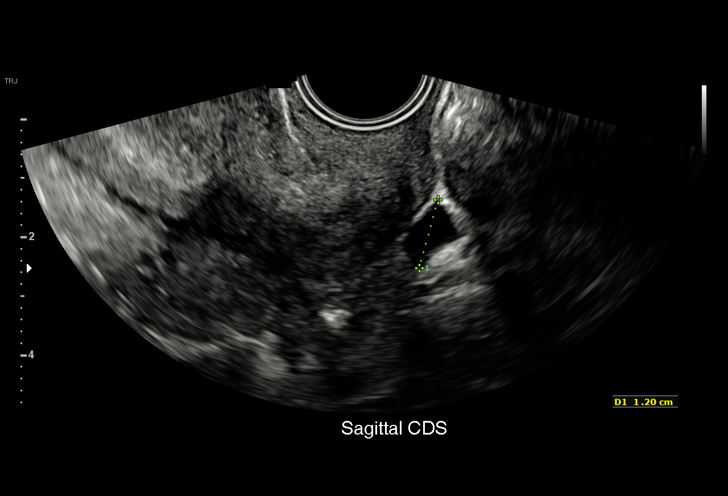
[im 20/26]
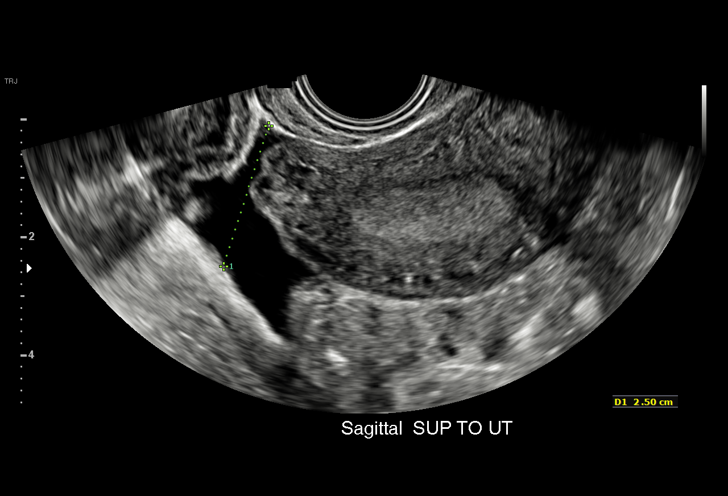
[im 22/26]
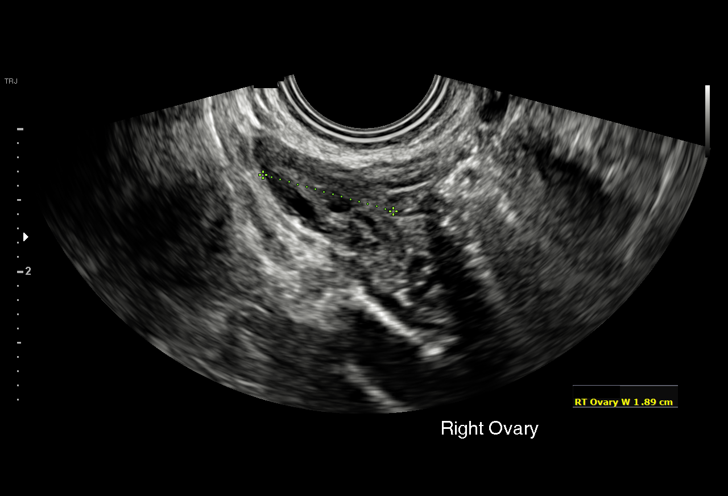
[im 24/26]
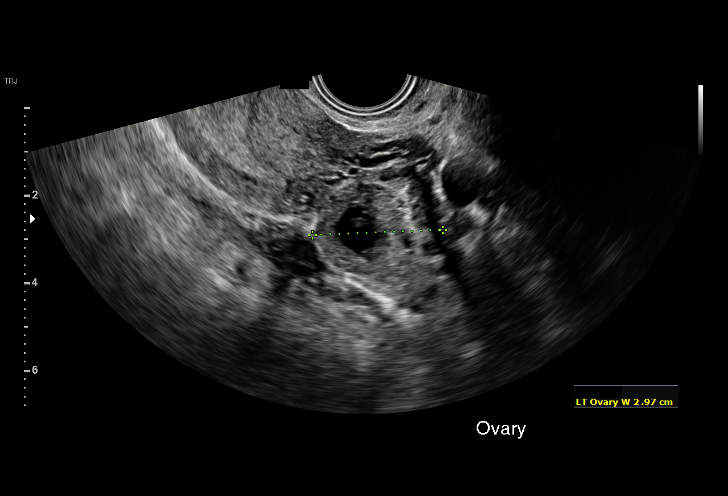
[im 26/26]
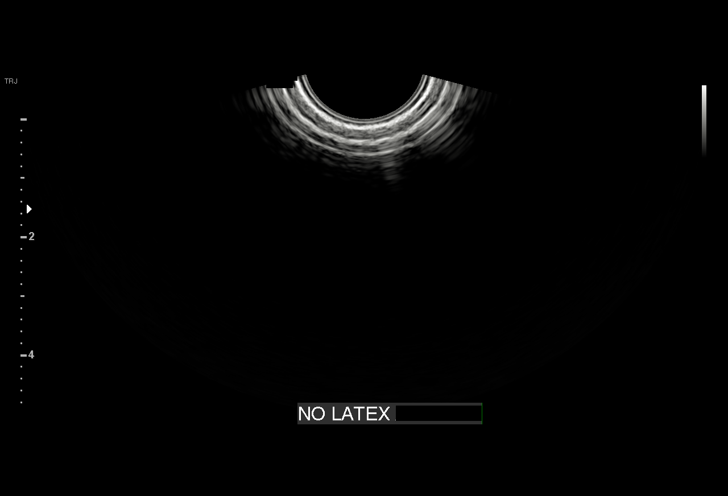

[15 of 26 positions shown; findings below may reference images not displayed]

FINDINGS: Intrauterine gestational sac: Single

Yolk sac:  Visualized.

Embryo:  Not Visualized.

Cardiac Activity: Not Visualized.

MSD: 8  mm   5 w   4  d

Subchorionic hemorrhage:  None visualized.

Maternal uterus/adnexae:

Subchorionic hemorrhage: None

Right ovary: Normal

Left ovary: Normal

Other :None

Free fluid:  Small
IMPRESSION: 1. Probable early intrauterine gestational sac with yolk sac, but no
fetal pole, or cardiac activity yet visualized. Recommend follow-up
quantitative B-HCG levels and follow-up US in 14 days to assess
viability. This recommendation follows SRU consensus guidelines:
Diagnostic Criteria for Nonviable Pregnancy Early in the First
Trimester. N Engl J Med 9620; [DATE].

## 2019-07-23 ENCOUNTER — Encounter: Payer: Self-pay | Admitting: Obstetrics and Gynecology

## 2019-07-23 ENCOUNTER — Other Ambulatory Visit: Payer: Self-pay

## 2019-07-23 ENCOUNTER — Ambulatory Visit (INDEPENDENT_AMBULATORY_CARE_PROVIDER_SITE_OTHER): Payer: Medicaid Other | Admitting: *Deleted

## 2019-07-23 ENCOUNTER — Other Ambulatory Visit (HOSPITAL_COMMUNITY)
Admission: RE | Admit: 2019-07-23 | Discharge: 2019-07-23 | Disposition: A | Discharge status: Home / Self Care | Payer: Medicaid Other | Source: Ambulatory Visit | Attending: Obstetrics and Gynecology | Admitting: Obstetrics and Gynecology

## 2019-07-23 VITALS — BP 117/68 | HR 90 | Temp 98.2°F | Ht 61.0 in | Wt 94.8 lb

## 2019-07-23 DIAGNOSIS — Z01419 Encounter for gynecological examination (general) (routine) without abnormal findings: Secondary | ICD-10-CM | POA: Insufficient documentation

## 2019-07-23 DIAGNOSIS — Z113 Encounter for screening for infections with a predominantly sexual mode of transmission: Secondary | ICD-10-CM

## 2019-07-23 DIAGNOSIS — Z3201 Encounter for pregnancy test, result positive: Secondary | ICD-10-CM

## 2019-07-23 DIAGNOSIS — Z3480 Encounter for supervision of other normal pregnancy, unspecified trimester: Secondary | ICD-10-CM | POA: Insufficient documentation

## 2019-07-23 DIAGNOSIS — Z3200 Encounter for pregnancy test, result unknown: Secondary | ICD-10-CM

## 2019-07-23 LAB — POCT URINE PREGNANCY: Preg Test, Ur: POSITIVE — AB

## 2019-07-23 NOTE — Progress Notes (Signed)
   Ms. Rigdon presents today for UPT. She has no unusual complaints. LMP: 06/16/2019    OBJECTIVE: Appears well, in no apparent distress.  OB History    Gravida  3   Para  1   Term  1   Preterm      AB  1   Living  1     SAB  1   TAB      Ectopic      Multiple  0   Live Births  1          Home UPT Result:Positive In-Office UPT result: Positive I have reviewed the patient's medical, obstetrical, social, and family histories, and medications.   EDD: 03/22/2020 by LMP GA: [redacted]w[redacted]d [id number]  ASSESSMENT: Positive pregnancy test STD screen completed.  PLAN Prenatal care to be completed at: None. Patient stated she does not want to have another baby. Will call [facility].  Will inform patient of lab results.  Clovis Pu, RN

## 2019-07-24 LAB — RPR: RPR Ser Ql: NONREACTIVE

## 2019-07-24 LAB — HIV ANTIBODY (ROUTINE TESTING W REFLEX): HIV Screen 4th Generation wRfx: NONREACTIVE

## 2019-07-27 LAB — CERVICOVAGINAL ANCILLARY ONLY
Bacterial Vaginitis (gardnerella): POSITIVE — AB
Candida Glabrata: NEGATIVE
Candida Vaginitis: NEGATIVE
Chlamydia: POSITIVE — AB
Comment: NEGATIVE
Comment: NEGATIVE
Comment: NEGATIVE
Comment: NEGATIVE
Comment: NEGATIVE
Comment: NORMAL
Neisseria Gonorrhea: NEGATIVE
Trichomonas: NEGATIVE

## 2019-07-28 ENCOUNTER — Encounter: Payer: Self-pay | Admitting: General Practice

## 2019-07-28 ENCOUNTER — Telehealth: Payer: Self-pay | Admitting: *Deleted

## 2019-07-28 DIAGNOSIS — A749 Chlamydial infection, unspecified: Secondary | ICD-10-CM

## 2019-07-28 DIAGNOSIS — B9689 Other specified bacterial agents as the cause of diseases classified elsewhere: Secondary | ICD-10-CM

## 2019-07-28 MED ORDER — METRONIDAZOLE 500 MG PO TABS: 500.0000 mg | ORAL_TABLET | Freq: Two times a day (BID) | ORAL | 0 refills | Status: DC

## 2019-07-28 MED ORDER — AZITHROMYCIN 500 MG PO TABS: 1000.0000 mg | ORAL_TABLET | Freq: Every day | ORAL | 0 refills | Status: AC

## 2019-07-28 NOTE — Telephone Encounter (Signed)
Confidential Communicable Disease Report faxed to [facility] [phone].  Clovis Pu, RN

## 2019-07-28 NOTE — Telephone Encounter (Signed)
-----   Message from [name], [place] sent at 07/27/2019 10:31 PM EST ----- Please treat for CT & BV. Please notify the patient

## 2019-08-13 IMAGING — US US OB TRANSVAGINAL
1 series · 15 of 28 positions shown · non-contrast
Comparison: None.

CLINICAL DATA: Vaginal bleeding

EXAM:
TRANSVAGINAL OB ULTRASOUND
TECHNIQUE: Transvaginal ultrasound was performed for complete evaluation of the
gestation as well as the maternal uterus, adnexal regions, and
pelvic cul-de-sac.

[Series 1: us ob transvaginal · 15 of 34 slices shown]
[im 1/34]
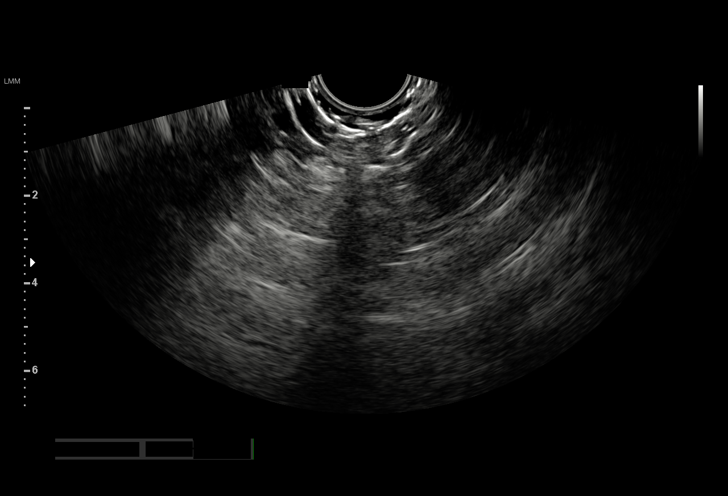
[im 3/34]
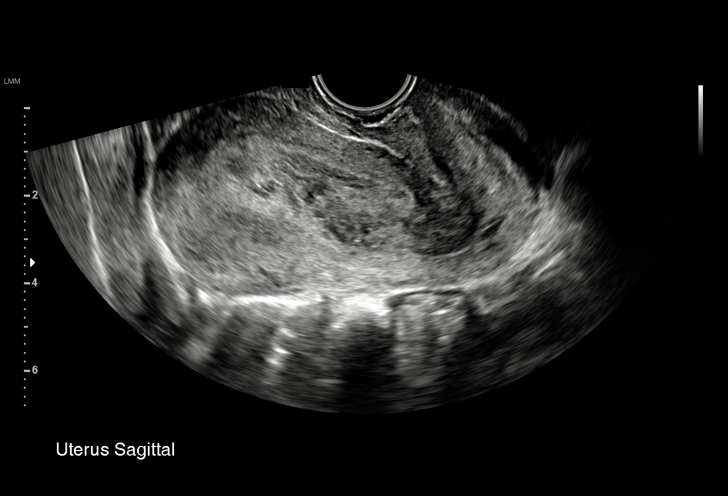
[im 5/34]
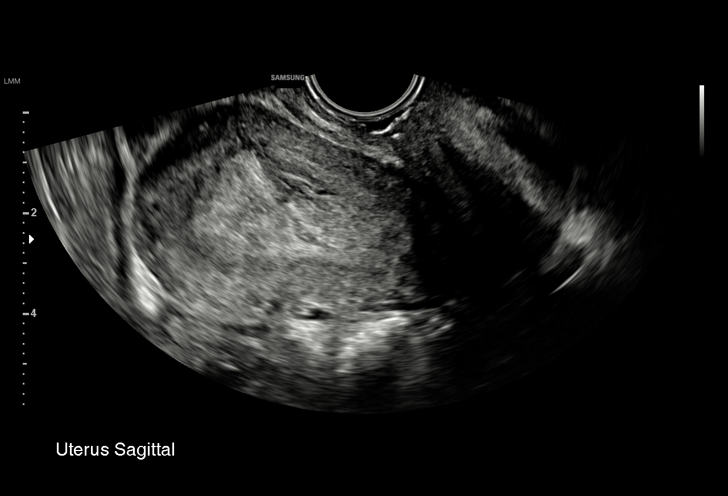
[im 8/34]
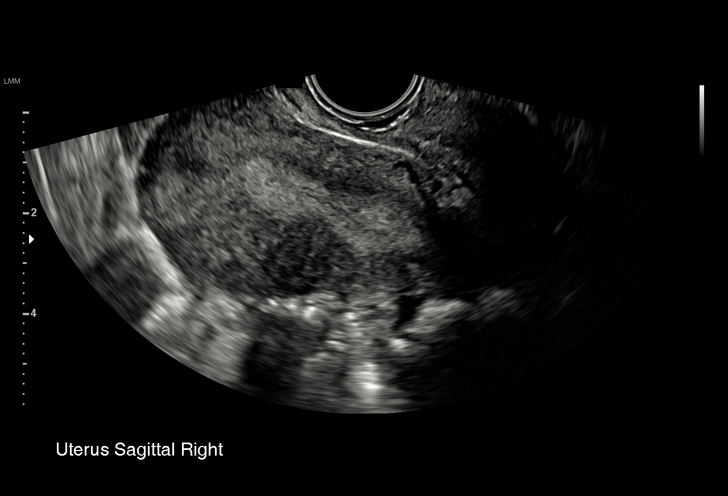
[im 10/34]
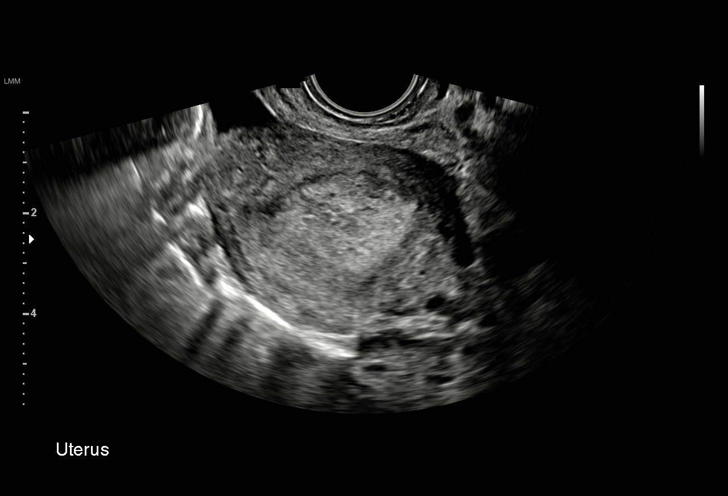
[im 13/34]
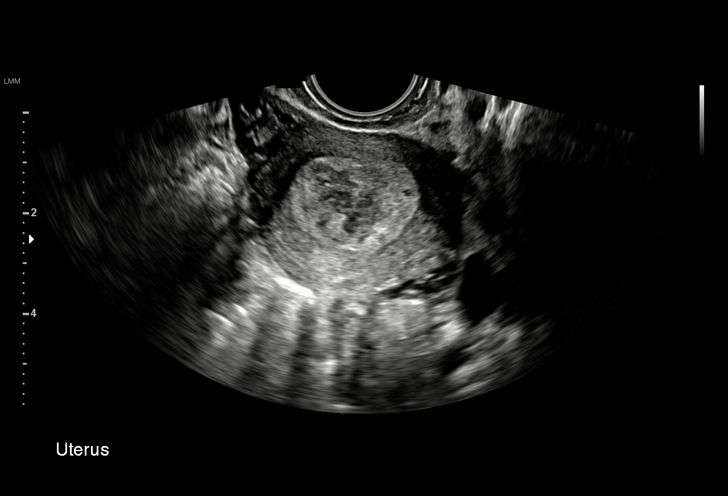
[im 15/34]
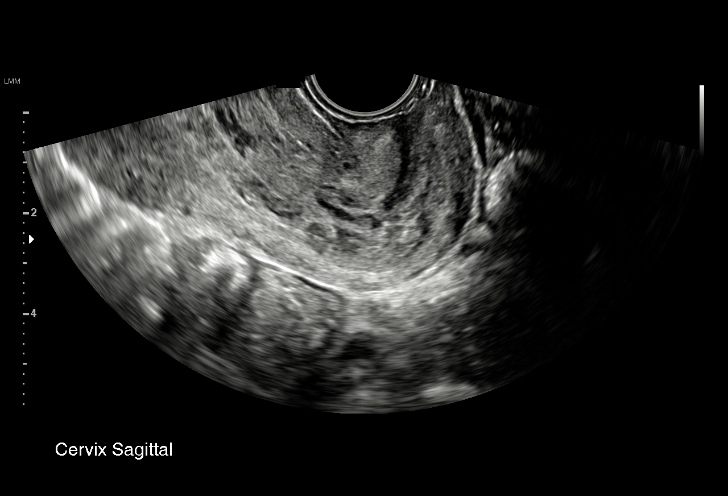
[im 18/34]
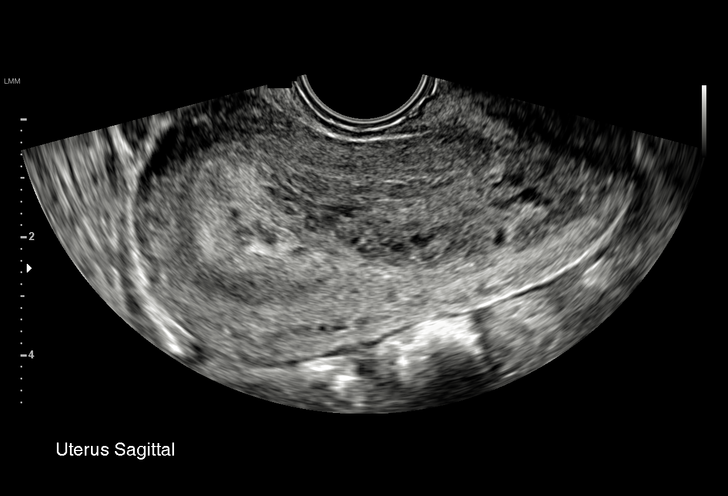
[im 19/34]
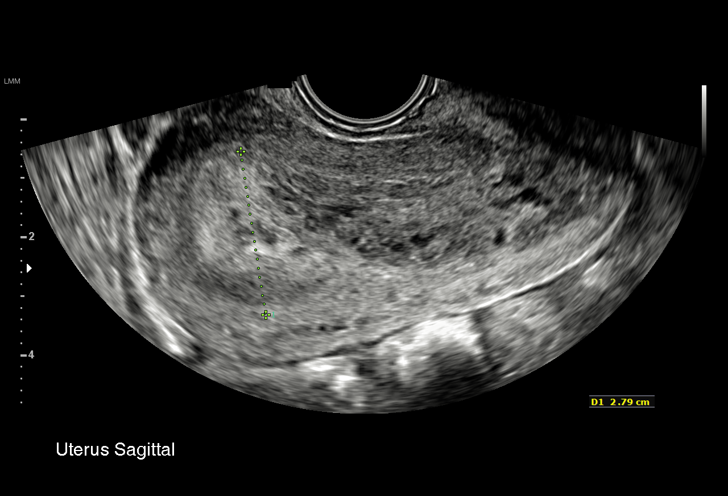
[im 21/34]
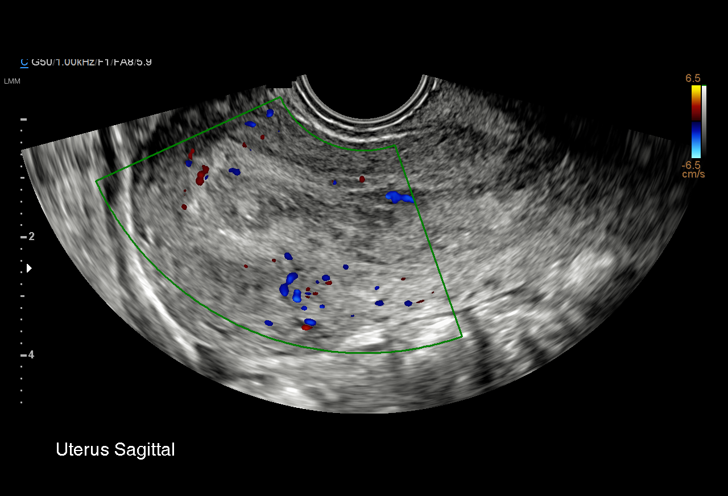
[im 24/34]
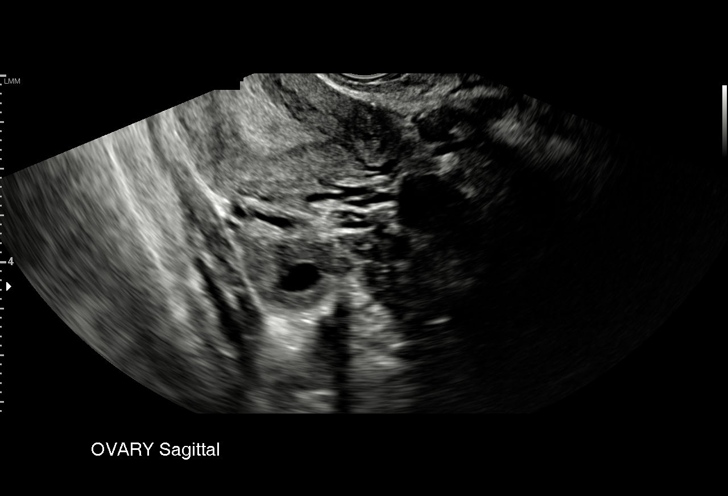
[im 26/34]
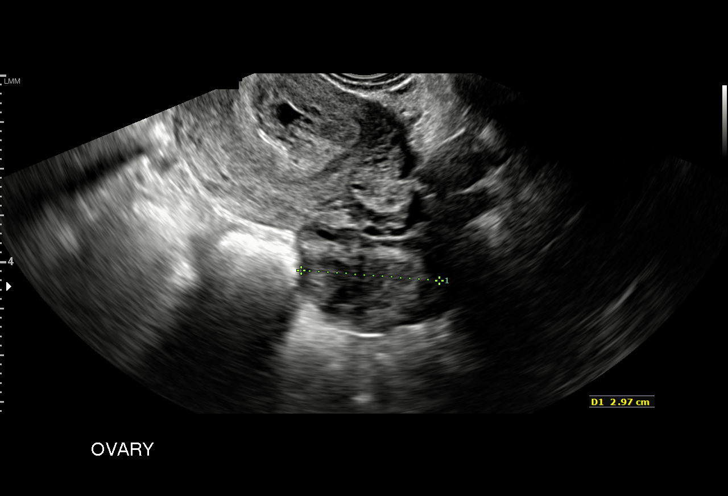
[im 29/34]
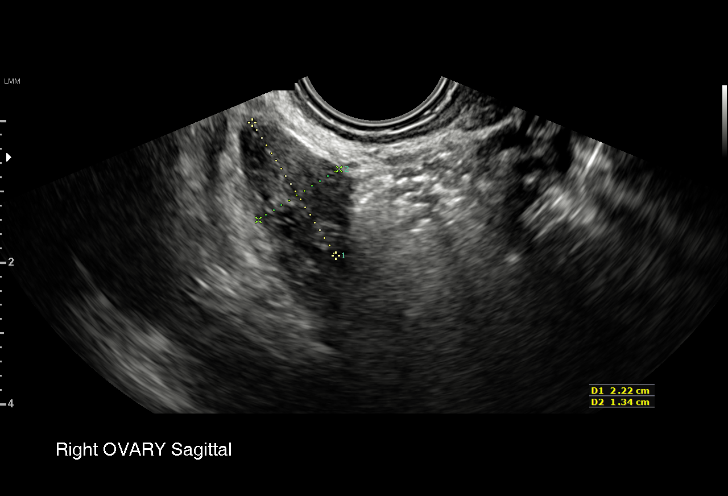
[im 31/34]
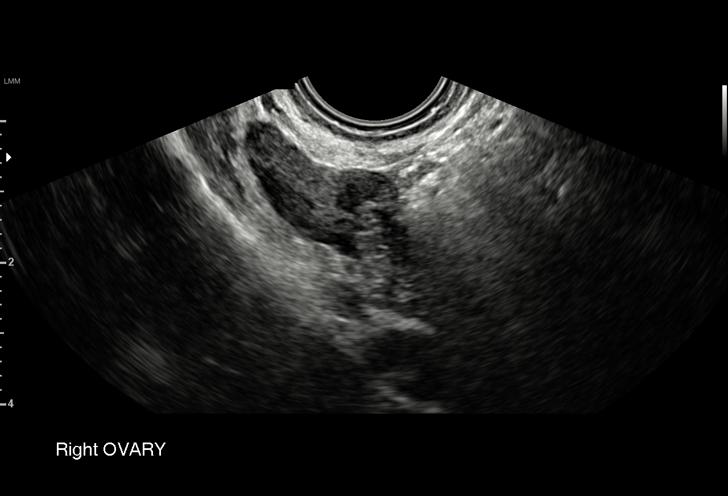
[im 34/34]
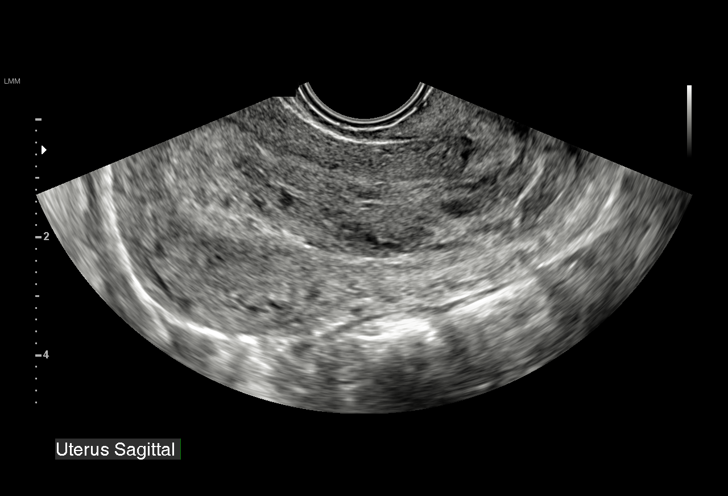

[15 of 28 positions shown; findings below may reference images not displayed]

FINDINGS: Intrauterine gestational sac: None

Yolk sac:  Not Visualized.

Embryo:  Not Visualized.

Cardiac Activity: Not Visualized.

Subchorionic hemorrhage:  None visualized.

Maternal uterus/adnexae:

Subchorionic hemorrhage: None

Right ovary: Normal

Left ovary: Normal

Other :The endometrium is diffusely thickened and heterogeneous in
appearance measuring 28 mm. Areas of incurred wall blood flow
identified.

Free fluid:  None
IMPRESSION: 1. No intrauterine gestational sac, yolk sac, or fetal pole
identified. Differential considerations include intrauterine
pregnancy too early to be sonographically visualized, missed
abortion, or ectopic pregnancy. Followup ultrasound is recommended
in 10-14 days for further evaluation.
2. Heterogeneous endometrium is abnormally thickened measuring up to
28 mm. In the setting of missed abortion findings are concerning for
retained products of conception.

## 2019-08-17 ENCOUNTER — Ambulatory Visit: Payer: Medicaid Other

## 2019-08-24 ENCOUNTER — Other Ambulatory Visit (HOSPITAL_COMMUNITY)
Admission: RE | Admit: 2019-08-24 | Discharge: 2019-08-24 | Disposition: A | Discharge status: Home / Self Care | Payer: Medicaid Other | Source: Ambulatory Visit | Attending: Obstetrics and Gynecology | Admitting: Obstetrics and Gynecology

## 2019-08-24 ENCOUNTER — Ambulatory Visit (INDEPENDENT_AMBULATORY_CARE_PROVIDER_SITE_OTHER): Payer: Medicaid Other | Admitting: *Deleted

## 2019-08-24 ENCOUNTER — Other Ambulatory Visit: Payer: Self-pay

## 2019-08-24 VITALS — BP 113/72 | HR 98 | Temp 98.0°F | Ht 61.0 in | Wt 94.0 lb

## 2019-08-24 DIAGNOSIS — Z113 Encounter for screening for infections with a predominantly sexual mode of transmission: Secondary | ICD-10-CM | POA: Insufficient documentation

## 2019-08-24 DIAGNOSIS — Z3480 Encounter for supervision of other normal pregnancy, unspecified trimester: Secondary | ICD-10-CM

## 2019-08-24 NOTE — Progress Notes (Signed)
   SUBJECTIVE:  19 y.o. female in clinic for TOC. Denies abnormal vaginal bleeding or significant pelvic pain or fever. No UTI symptoms. Denies history of known exposure to STD.  Patient's last menstrual period was 06/16/2019 (exact date).  OBJECTIVE:  She appears well, afebrile. Urine dipstick: not done.  ASSESSMENT:  STD re-screening for +Chlamydia 07/23/2019 Recent pregnancy. Patient had AB on 07/31/19.   PLAN:  GC, chlamydia, trichomonas, BVAG, CVAG probe sent to lab. Treatment: To be determined once lab results are received ROV prn if symptoms persist or worsen.  Clovis Pu, RN

## 2019-08-25 LAB — CERVICOVAGINAL ANCILLARY ONLY
Bacterial Vaginitis (gardnerella): POSITIVE — AB
Candida Glabrata: NEGATIVE
Candida Vaginitis: NEGATIVE
Chlamydia: NEGATIVE
Comment: NEGATIVE
Comment: NEGATIVE
Comment: NEGATIVE
Comment: NEGATIVE
Comment: NEGATIVE
Comment: NORMAL
Neisseria Gonorrhea: NEGATIVE
Trichomonas: NEGATIVE

## 2019-08-26 ENCOUNTER — Telehealth: Payer: Self-pay | Admitting: *Deleted

## 2019-08-26 DIAGNOSIS — B9689 Other specified bacterial agents as the cause of diseases classified elsewhere: Secondary | ICD-10-CM

## 2019-08-26 DIAGNOSIS — O23599 Infection of other part of genital tract in pregnancy, unspecified trimester: Secondary | ICD-10-CM

## 2019-08-26 MED ORDER — METRONIDAZOLE 500 MG PO TABS: 500.0000 mg | ORAL_TABLET | Freq: Two times a day (BID) | ORAL | 0 refills | Status: DC

## 2019-08-26 NOTE — Telephone Encounter (Signed)
-----   Message from [name], [place] sent at 08/25/2019  9:11 PM EDT ----- Treat for BV

## 2019-09-20 ENCOUNTER — Other Ambulatory Visit: Payer: Self-pay

## 2019-09-20 ENCOUNTER — Encounter (HOSPITAL_COMMUNITY): Payer: Self-pay | Admitting: Emergency Medicine

## 2019-09-20 ENCOUNTER — Emergency Department (HOSPITAL_COMMUNITY)
Admission: EM | Admit: 2019-09-20 | Discharge: 2019-09-20 | Disposition: A | Discharge status: Home / Self Care | Payer: Medicaid Other | Attending: Emergency Medicine | Admitting: Emergency Medicine

## 2019-09-20 DIAGNOSIS — Y929 Unspecified place or not applicable: Secondary | ICD-10-CM | POA: Insufficient documentation

## 2019-09-20 DIAGNOSIS — H00016 Hordeolum externum left eye, unspecified eyelid: Secondary | ICD-10-CM | POA: Diagnosis not present

## 2019-09-20 DIAGNOSIS — Y999 Unspecified external cause status: Secondary | ICD-10-CM | POA: Diagnosis not present

## 2019-09-20 DIAGNOSIS — H00015 Hordeolum externum left lower eyelid: Secondary | ICD-10-CM

## 2019-09-20 DIAGNOSIS — Y939 Activity, unspecified: Secondary | ICD-10-CM | POA: Diagnosis not present

## 2019-09-20 DIAGNOSIS — W208XXA Other cause of strike by thrown, projected or falling object, initial encounter: Secondary | ICD-10-CM | POA: Diagnosis not present

## 2019-09-20 DIAGNOSIS — H5712 Ocular pain, left eye: Secondary | ICD-10-CM | POA: Diagnosis present

## 2019-09-20 DIAGNOSIS — S0502XA Injury of conjunctiva and corneal abrasion without foreign body, left eye, initial encounter: Secondary | ICD-10-CM | POA: Insufficient documentation

## 2019-09-20 MED ORDER — AMOXICILLIN-POT CLAVULANATE 875-125 MG PO TABS: 1.0000 | ORAL_TABLET | Freq: Two times a day (BID) | ORAL | 0 refills | Status: DC

## 2019-09-20 MED ORDER — POLYMYXIN B-TRIMETHOPRIM 10000-0.1 UNIT/ML-% OP SOLN: 1.0000 [drp] | Freq: Three times a day (TID) | OPHTHALMIC | 0 refills | Status: DC

## 2019-09-20 MED ORDER — POLYMYXIN B-TRIMETHOPRIM 10000-0.1 UNIT/ML-% OP SOLN
1.0000 [drp] | Freq: Once | OPHTHALMIC | Status: AC
  Administered 2019-09-20: 1 [drp] via OPHTHALMIC
  Filled 2019-09-20: qty 10

## 2019-09-20 MED ORDER — TETRACAINE HCL 0.5 % OP SOLN
2.0000 [drp] | Freq: Once | OPHTHALMIC | Status: AC
  Administered 2019-09-20: 22:00:00 2 [drp] via OPHTHALMIC
  Filled 2019-09-20: qty 4

## 2019-09-20 MED ORDER — FLUORESCEIN SODIUM 1 MG OP STRP
1.0000 | ORAL_STRIP | Freq: Once | OPHTHALMIC | Status: AC
  Administered 2019-09-20: 1 via OPHTHALMIC
  Filled 2019-09-20: qty 1

## 2019-09-20 MED ORDER — AMOXICILLIN-POT CLAVULANATE 875-125 MG PO TABS
1.0000 | ORAL_TABLET | Freq: Once | ORAL | Status: AC
  Administered 2019-09-20: 22:00:00 1 via ORAL
  Filled 2019-09-20: qty 1

## 2019-09-20 NOTE — ED Triage Notes (Signed)
Pt reports that 2 days ago felt like something in her left eye. Tried flushing it. Had blurred vision in left eye since yesterday with itching. Reports 2 days ago eye was crusted over with drainage.

## 2019-09-20 NOTE — ED Provider Notes (Signed)
[facility]-EMERGENCY DEPT Provider Note   CSN: [id number] Arrival date & time: 09/20/19  1636     History Chief Complaint  Patient presents with  . Eye Problem    Victoria Frye is a 19 y.o. female history anemia, here presenting with left eye pain and swelling.  Patient states that about 2 days ago, she thought something went into her left eye.  She was rubbing it and noticed some swelling and discharge from the left lower eyelid.  She states that she has some blurry vision today so came in for evaluation.  Denies any fevers or chills.  Denies any previous eye infection.  The history is provided by the patient.       Past Medical History:  Diagnosis Date  . Anemia   . Anemia affecting pregnancy in third trimester 04/11/2018  . Chlamydia trachomatis infection in mother during third trimester of pregnancy 06/07/2018  . GERD (gastroesophageal reflux disease)   . Medical history non-contributory   . Miscarriage within last 12 months   . Normal labor 06/27/2018    Patient Active Problem List   Diagnosis Date Noted  . MDD (major depressive disorder), recurrent episode, severe (HCC) 10/16/2018    Past Surgical History:  Procedure Laterality Date  . 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 DILATION AND EVACUATION N/A 07/08/2017   Procedure: DILATATION AND EVACUATION;  Surgeon: Adam Phenix, MD;  Location: [facility];  Service: Gynecology;  Laterality: N/A;     OB History    Gravida  3   Para  1   Term  1   Preterm      AB  1   Living  1     SAB  1   TAB      Ectopic      Multiple  0   Live Births  1           No family history on file.  Social History   Tobacco Use  . Smoking status: Never Smoker  . Smokeless tobacco: Never Used  Substance Use Topics  . Alcohol use: No  . Drug use: Not Currently    Types: Marijuana    Comment: not since pregnant. Per pt stopped 08/27/17    Home  Medications Prior to Admission medications   Medication Sig Start Date End Date Taking? Authorizing Provider  metroNIDAZOLE (FLAGYL) 500 MG tablet Take 1 tablet (500 mg total) by mouth 2 (two) times daily. 08/26/19   [name], CNM    Allergies    Banana and Other  Review of Systems   Review of Systems  Eyes: Positive for discharge and redness.  All other systems reviewed and are negative.   Physical Exam Updated Vital Signs BP 117/82   Pulse 88   Temp 99.2 F (37.3 C) (Oral)   Resp 18   SpO2 100%   Physical Exam Vitals and nursing note reviewed.  Constitutional:      Appearance: Normal appearance.  HENT:     Head: Normocephalic.     Nose: Nose normal.     Mouth/Throat:     Mouth: Mucous membranes are moist.  Eyes:     Comments: Stye left lower eyelid.  There is some discharge from it as well.  I stained the eye with fluorescein and there is corneal abrasion right next to the stye on the 6 o'clock position.  There is no foreign  body under the eye lid.   Cardiovascular:     Rate and Rhythm: Normal rate and regular rhythm.     Pulses: Normal pulses.  Pulmonary:     Effort: Pulmonary effort is normal.  Abdominal:     General: Abdomen is flat.  Musculoskeletal:        General: Normal range of motion.     Cervical back: Normal range of motion.  Skin:    General: Skin is warm.     Capillary Refill: Capillary refill takes less than 2 seconds.  Neurological:     General: No focal deficit present.     Mental Status: She is alert.  Psychiatric:        Mood and Affect: Mood normal.     ED Results / Procedures / Treatments   Labs (all labs ordered are listed, but only abnormal results are displayed) Labs Reviewed - No data to display  EKG None  Radiology No results found.  Procedures Procedures (including critical care time)  Medications Ordered in ED Medications  tetracaine (PONTOCAINE) 0.5 % ophthalmic solution 2 drop (has no administration in time  range)  fluorescein ophthalmic strip 1 strip (has no administration in time range)  amoxicillin-clavulanate (AUGMENTIN) 875-125 MG per tablet 1 tablet (has no administration in time range)  trimethoprim-polymyxin b (POLYTRIM) ophthalmic solution 1 drop (has no administration in time range)    ED Course  I have reviewed the triage vital signs and the nursing notes.  Pertinent labs & imaging results that were available during my care of the patient were reviewed by me and considered in my medical decision making (see chart for details).    MDM Rules/Calculators/A&P                      Victoria Frye is a 19 y.o. female here with stye and left eye corneal abrasion.  Extraocular movements intact. There is no signs of periorbital or orbital cellulitis.  Will discharge home with Polytrim as well as Augmentin.  We will have her follow-up with eye doctor as needed.  Final Clinical Impression(s) / ED Diagnoses Final diagnoses:  None    Rx / DC Orders ED Discharge Orders    None       Drenda Freeze, MD 09/20/19 2213

## 2019-09-20 NOTE — Discharge Instructions (Signed)
You have a stye that got infected.  He also have a corneal abrasion.  Please use Polytrim drops 3 times daily for a week.  Also use Augmentin twice daily for a week.  Follow-up with eye doctor if this is not improving.  Return to ER if you have worse blurry vision, eye pain and swelling, fevers, double vision

## 2019-09-21 IMAGING — US US TRANSVAGINAL NON-OB
1 series · 15 of 25 positions shown · non-contrast
Comparison: 06/27/2017

CLINICAL DATA: Miscarriage, retained products of conception

EXAM:
ULTRASOUND PELVIS TRANSVAGINAL
TECHNIQUE: Transvaginal ultrasound examination of the pelvis was performed
including evaluation of the uterus, ovaries, adnexal regions, and
pelvic cul-de-sac.

[Series 1: us transvaginal non-ob · 15 of 36 slices shown]
[im 1/36]
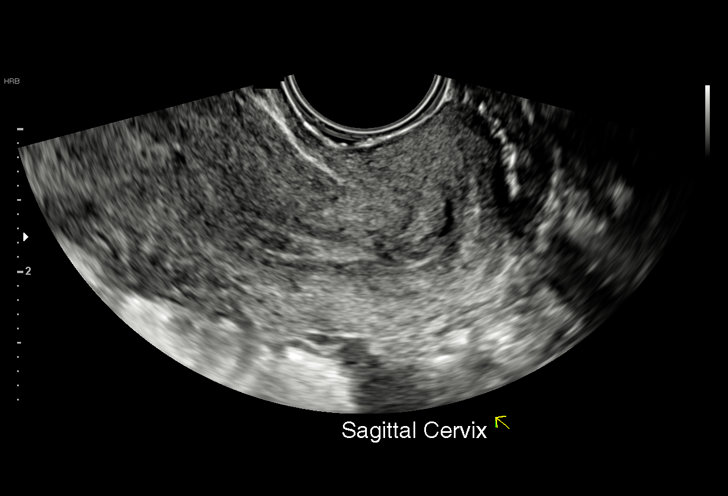
[im 3/36]
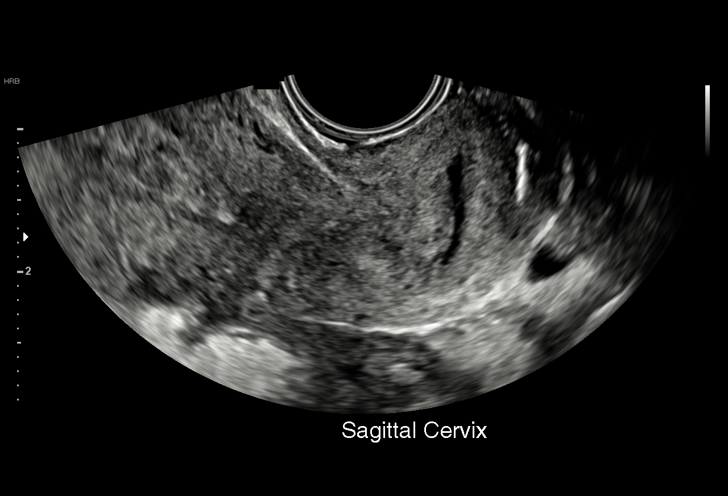
[im 6/36]
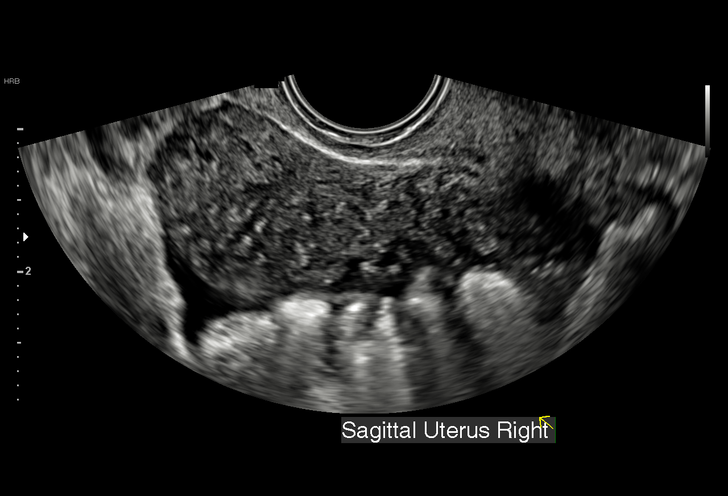
[im 8/36]
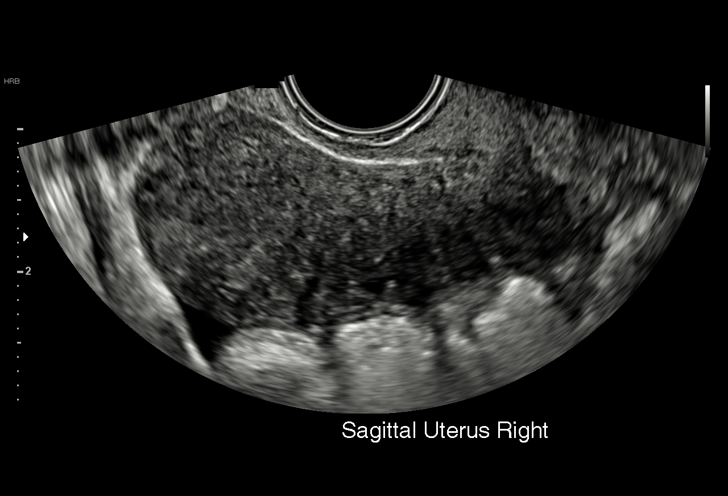
[im 11/36]
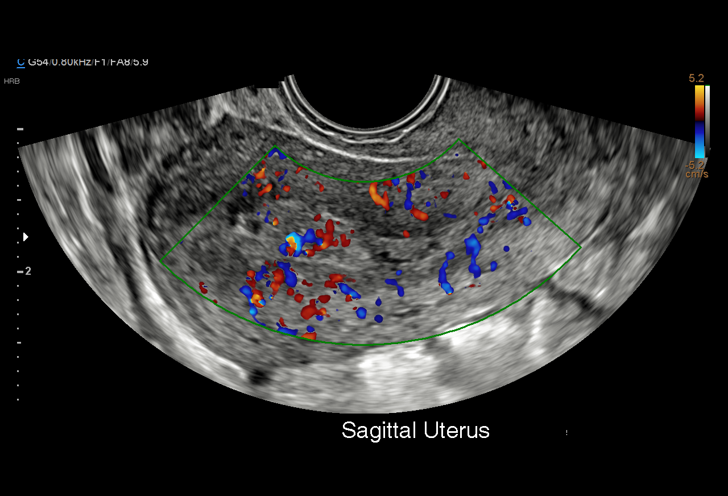
[im 14/36]
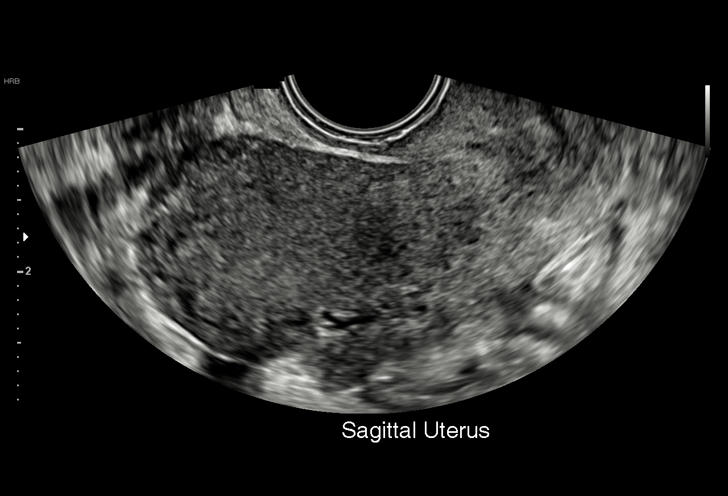
[im 15/36]
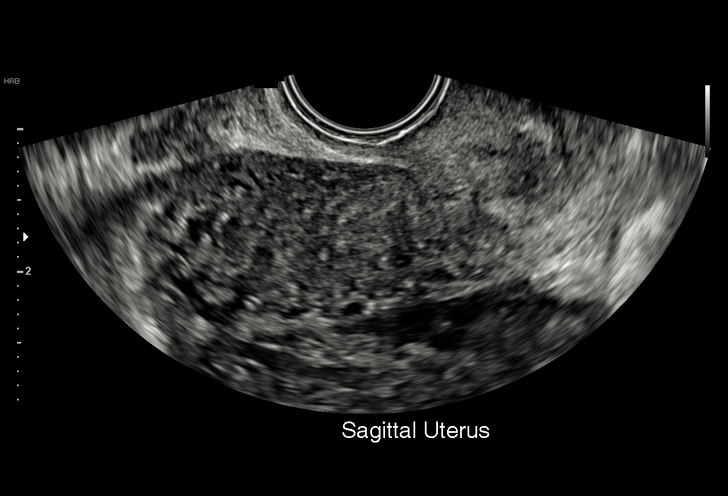
[im 18/36]
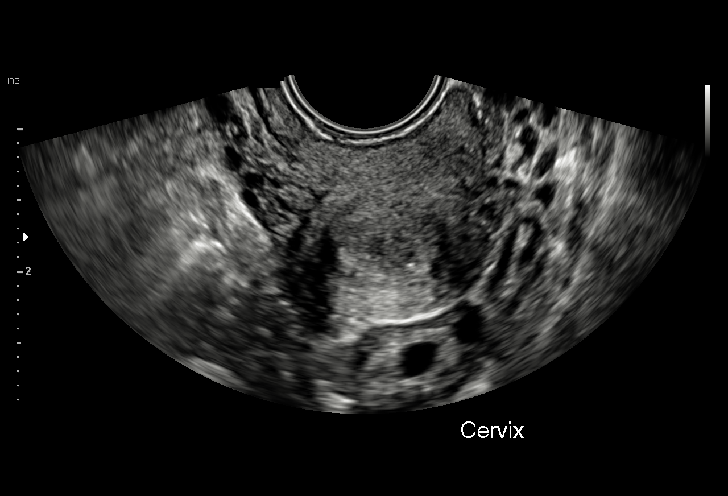
[im 21/36]
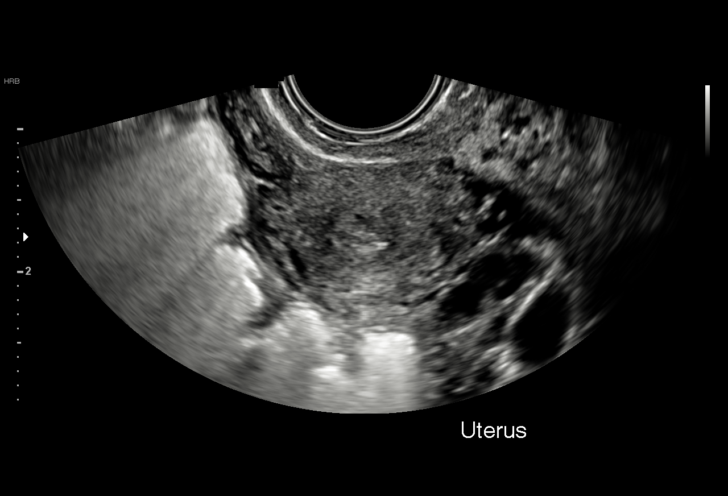
[im 22/36]
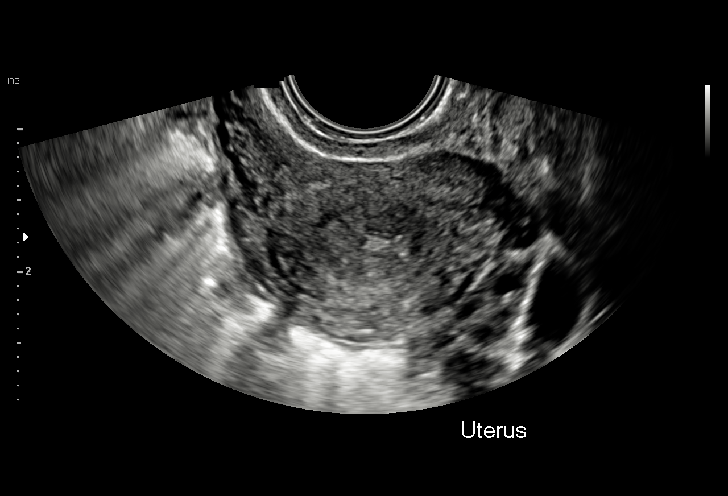
[im 25/36]
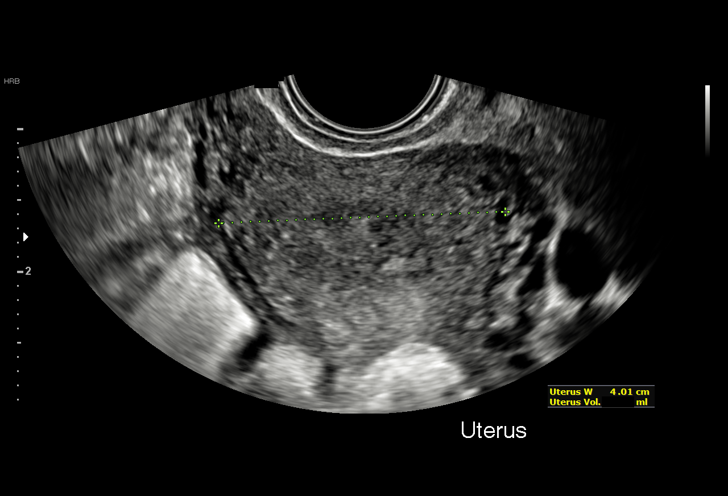
[im 28/36]
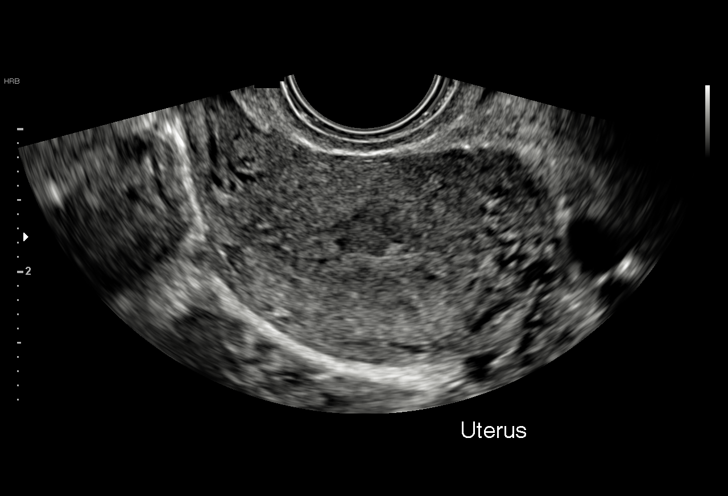
[im 30/36]
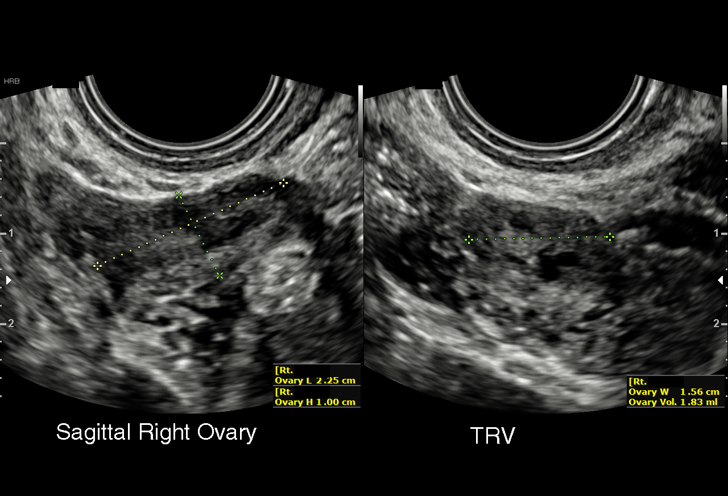
[im 33/36]
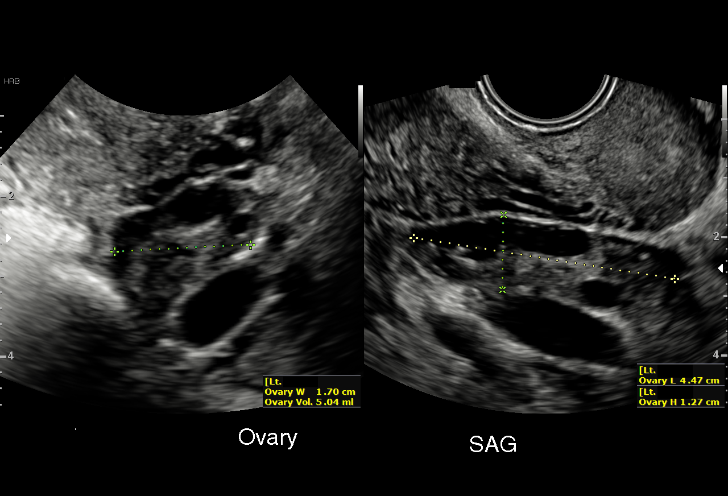
[im 36/36]
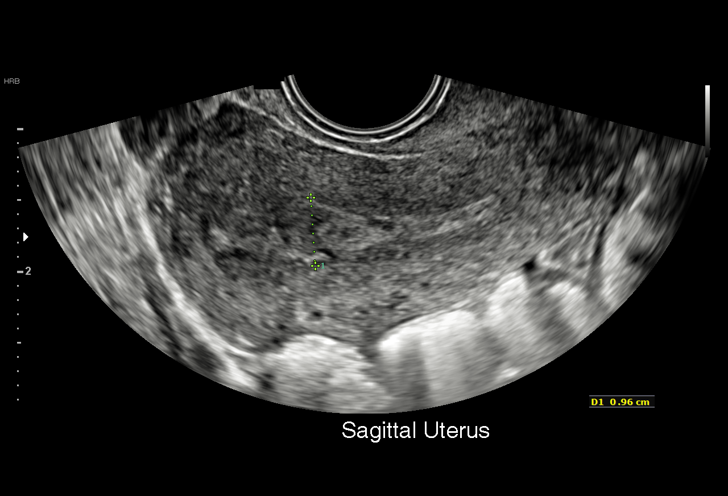

[15 of 25 positions shown; findings below may reference images not displayed]

FINDINGS: Uterus

Measurements: 7.1 x 3.7 x 4.0 cm. Normal uterine morphology without
mass

Endometrium

Thickness: 10 mm thick. Heterogeneous appearance of the endometrial
complex at the upper to mid uterus, somewhat focally prominent, with
significant hypervascularity on color Doppler imaging, suspicious
for retained products of conception. Tiny amount of endometrial and
endocervical fluid.

Right ovary

Measurements: 2.3 x 1.0 x 1.6 cm.  Normal morphology without mass

Left ovary

Measurements: 4.5 x 1.3 x 1.7 cm.  Normal morphology without mass

Other findings:  Trace free pelvic fluid.  No adnexal masses.
IMPRESSION: Heterogeneous appearance of the endometrial complex at the upper to
mid uterine segment, focally thickened and hypervascular on color
Doppler imaging, suspicious for retained products of conception.

## 2019-09-23 ENCOUNTER — Ambulatory Visit: Payer: Medicaid Other | Admitting: Women's Health

## 2019-12-21 ENCOUNTER — Encounter (HOSPITAL_COMMUNITY): Payer: Self-pay

## 2019-12-21 ENCOUNTER — Encounter (HOSPITAL_COMMUNITY): Payer: Self-pay | Admitting: Emergency Medicine

## 2019-12-21 ENCOUNTER — Emergency Department (HOSPITAL_COMMUNITY)
Admission: EM | Admit: 2019-12-21 | Discharge: 2019-12-21 | Disposition: A | Discharge status: Home / Self Care | Payer: Medicaid Other | Source: Home / Self Care

## 2019-12-21 ENCOUNTER — Other Ambulatory Visit: Payer: Self-pay

## 2019-12-21 DIAGNOSIS — Z5321 Procedure and treatment not carried out due to patient leaving prior to being seen by health care provider: Secondary | ICD-10-CM | POA: Insufficient documentation

## 2019-12-21 DIAGNOSIS — K0889 Other specified disorders of teeth and supporting structures: Secondary | ICD-10-CM | POA: Insufficient documentation

## 2019-12-21 DIAGNOSIS — R52 Pain, unspecified: Secondary | ICD-10-CM | POA: Diagnosis not present

## 2019-12-21 NOTE — ED Triage Notes (Signed)
Pt verbalizes had upper/lower right molars pulled this am and "they didn't give me any pain meds."

## 2019-12-21 NOTE — ED Triage Notes (Signed)
Pt sts wisdom teeth removed today and was given no rx.

## 2019-12-22 ENCOUNTER — Emergency Department (HOSPITAL_COMMUNITY)
Admission: EM | Admit: 2019-12-22 | Discharge: 2019-12-22 | Disposition: A | Discharge status: Home / Self Care | Payer: Medicaid Other | Attending: Emergency Medicine | Admitting: Emergency Medicine

## 2019-12-22 DIAGNOSIS — K0889 Other specified disorders of teeth and supporting structures: Secondary | ICD-10-CM

## 2019-12-22 MED ORDER — OXYCODONE-ACETAMINOPHEN 5-325 MG PO TABS: 1.0000 | ORAL_TABLET | Freq: Three times a day (TID) | ORAL | 0 refills | Status: DC | PRN

## 2019-12-22 NOTE — ED Provider Notes (Signed)
[facility]-EMERGENCY DEPT Provider Note   CSN: [id number] Arrival date & time: 12/21/19  2331     History Chief Complaint  Patient presents with  . Dental Pain    Victoria Frye is a 19 y.o. female.  HPI   Patient presents to the emergency department with chief complaint of dental pain that has been going on since yesterday.  Patient states she had her wisdom teeth extracted on Monday and she has been in pain ever since.  Patient states when she went to the pharmacy to pick up her medications there were none to be found, she tried calling the dentist who performed procedure and they said they sent a prescription over but there is none available for her.  She has been taking Tylenol and ibuprofen without relief, admits that the pain is a constant throbbing pain and it is worse when she tries to eat anything.  She denies any alleviating factors.  She denies fever, chills, swelling in her throat or tongue, difficulty breathing, nausea or vomiting.  Patient has significant medical history of anemia, GERD, miscarriage.  Patient denies headache, fever, chills, sore throat, cough, chest pain, shortness of breath, abdominal pain, dysuria, pedal edema.  Past Medical History:  Diagnosis Date  . Anemia   . Anemia affecting pregnancy in third trimester 04/11/2018  . Chlamydia trachomatis infection in mother during third trimester of pregnancy 06/07/2018  . GERD (gastroesophageal reflux disease)   . Medical history non-contributory   . Miscarriage within last 12 months   . Normal labor 06/27/2018    Patient Active Problem List   Diagnosis Date Noted  . MDD (major depressive disorder), recurrent episode, severe (HCC) 10/16/2018    Past Surgical History:  Procedure Laterality Date  . 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 DILATION AND EVACUATION N/A 07/08/2017   Procedure: DILATATION AND EVACUATION;  Surgeon: Adam Phenix, MD;  Location:  [facility];  Service: Gynecology;  Laterality: N/A;     OB History    Gravida  3   Para  1   Term  1   Preterm      AB  1   Living  1     SAB  1   TAB      Ectopic      Multiple  0   Live Births  1           No family history on file.  Social History   Tobacco Use  . Smoking status: Never Smoker  . Smokeless tobacco: Never Used  Vaping Use  . Vaping Use: Never used  Substance Use Topics  . Alcohol use: No  . Drug use: Not Currently    Types: Marijuana    Comment: not since pregnant. Per pt stopped 08/27/17    Home Medications Prior to Admission medications   Medication Sig Start Date End Date Taking? Authorizing Provider  amoxicillin-clavulanate (AUGMENTIN) 875-125 MG tablet Take 1 tablet by mouth 2 (two) times daily. One po bid x 7 days 09/20/19   Charlynne Pander, MD  metroNIDAZOLE (FLAGYL) 500 MG tablet Take 1 tablet (500 mg total) by mouth 2 (two) times daily. 08/26/19   [name], CNM  oxyCODONE-acetaminophen (PERCOCET/ROXICET) 5-325 MG tablet Take 1-2 tablets by mouth every 8 (eight) hours as needed for severe pain. 12/22/19   Carroll Sage, PA-C  trimethoprim-polymyxin b (POLYTRIM) ophthalmic solution Place 1 drop into the  right eye every 8 (eight) hours. 09/20/19   Charlynne Pander, MD    Allergies    Banana and Other  Review of Systems   Review of Systems  Constitutional: Negative for chills and fever.  HENT: Positive for dental problem. Negative for congestion, ear discharge, sore throat and trouble swallowing.   Eyes: Negative for visual disturbance.  Respiratory: Negative for shortness of breath.   Cardiovascular: Negative for chest pain and leg swelling.  Gastrointestinal: Negative for abdominal pain, diarrhea, nausea and vomiting.  Genitourinary: Negative for enuresis and flank pain.  Musculoskeletal: Negative for back pain and myalgias.  Skin: Negative for rash.  Neurological: Negative for dizziness, weakness and headaches.   Hematological: Does not bruise/bleed easily.    Physical Exam Updated Vital Signs BP (!) 113/101   Pulse 100   Temp 99.3 F (37.4 C)   Resp 20   Ht 5\' 2"  (1.575 m)   Wt (!) 44 kg   LMP 11/30/2019   SpO2 100%   BMI 17.74 kg/m   Physical Exam Vitals and nursing note reviewed.  Constitutional:      General: She is not in acute distress.    Appearance: She is not ill-appearing.  HENT:     Head: Normocephalic and atraumatic.     Nose: No congestion.     Mouth/Throat:     Mouth: Mucous membranes are moist.     Pharynx: Oropharynx is clear.     Comments: Patient's oropharynx was visualized, tongue and uvula were both midline, there is no edema noted, she is controlling her own secretions without any difficulty.    Patient's upper and lower right gum lines were visualized, stitches were in place, was not actively bleeding, mild erythema, no abscess, induration, fluctuance felt. Eyes:     General: No scleral icterus. Cardiovascular:     Rate and Rhythm: Normal rate and regular rhythm.     Pulses: Normal pulses.     Heart sounds: No murmur heard.  No friction rub. No gallop.   Pulmonary:     Effort: No respiratory distress.     Breath sounds: No wheezing, rhonchi or rales.  Abdominal:     General: There is no distension.     Tenderness: There is no abdominal tenderness. There is no guarding.  Musculoskeletal:        General: No swelling.  Skin:    General: Skin is warm and dry.     Findings: No rash.  Neurological:     Mental Status: She is alert.  Psychiatric:        Mood and Affect: Mood normal.     ED Results / Procedures / Treatments   Labs (all labs ordered are listed, but only abnormal results are displayed) Labs Reviewed - No data to display  EKG None  Radiology No results found.  Procedures Procedures (including critical care time)  Medications Ordered in ED Medications - No data to display  ED Course  I have reviewed the triage vital signs  and the nursing notes.  Pertinent labs & imaging results that were available during my care of the patient were reviewed by me and considered in my medical decision making (see chart for details).    MDM Rules/Calculators/A&P                          I have personally reviewed all imaging, labs and have interpreted them.  I have low suspicion for airway  obstruction as patient was nontoxic-appearing, oropharynx was visualized, uvula and tongue were both midline, controlling her own secretions with difficulty, on exam no wheezing or stridor heard heard.  Low suspicion for abscess as physical exam was benign no fluctuance or indurations felt.  I have low suspicion for lugwid angina as she was controlling her own secretions , no signs of impending airway obstruction, neck was palpated was nonswollen, patient was nontender to palpation underneath her tongue.  Due to nontoxic-appearing, stable vital signs, reassuring physical exam further lab work and imaging were not indicated at this time.  Patient appears to be resting comfortably in bed show no acute signs distress.  Vital signs remained stable does not meet criteria to be admitted to the hospital.  PDMP reviewed no narcotics prescribed.  Likely patient's pain and swelling on the right side of her face is secondary to wisdom teeth extraction and patient needs pain management.  I have prescribed her pain medication and recommend that she follows up with her dentist who performed her procedure for further management treatment.  Patient was discussed with attending who agrees with assessment and plan.  Patient was given at home care as well as strict return precautions.  Patient verbalized that she understood and agreed to plan. Final Clinical Impression(s) / ED Diagnoses Final diagnoses:  Pain, dental    Rx / DC Orders ED Discharge Orders         Ordered    oxyCODONE-acetaminophen (PERCOCET/ROXICET) 5-325 MG tablet  Every 8 hours PRN     Discontinue   Reprint     12/22/19 0810           Carroll Sage, PA-C 12/22/19 7829    Gwyneth Sprout, MD 12/22/19 1551

## 2019-12-22 NOTE — Discharge Instructions (Signed)
You have been seen here for dental pain.  Your exam and vitals look reassuring.  I prescribed you pain medication please take as prescribed.  Do not operate heavy machinery or drink while on this medication as it can cause you to become very drowsy.  This medication contains Tylenol in it please do not take Tylenol while taking this medication.  You may take ibuprofen with it every 6 hours please follow dosing on the back of bottle.  I also recommend that you apply ice to the area as this will decrease swelling and help with pain management.  I want you to call the dentist who performed the procedure yesterday and asked them about continue pain management and if you need to be on antibiotics.  I want to come back to the emergency department if you are unable to swallow your own saliva, swelling in your tongue or throat, difficulty breathing, chest pain, shortness of breath, nausea, vomiting, diarrhea as these symptoms require further evaluation management.

## 2020-02-29 ENCOUNTER — Ambulatory Visit: Payer: Medicaid Other

## 2020-03-01 ENCOUNTER — Other Ambulatory Visit: Payer: Self-pay

## 2020-03-01 ENCOUNTER — Ambulatory Visit (INDEPENDENT_AMBULATORY_CARE_PROVIDER_SITE_OTHER): Payer: Medicaid Other | Admitting: *Deleted

## 2020-03-01 VITALS — BP 106/55 | HR 88 | Temp 99.1°F | Ht 62.0 in | Wt 97.4 lb

## 2020-03-01 DIAGNOSIS — R8290 Unspecified abnormal findings in urine: Secondary | ICD-10-CM | POA: Diagnosis not present

## 2020-03-01 LAB — POCT URINALYSIS DIPSTICK (MANUAL)
Nitrite, UA: NEGATIVE
Poct Bilirubin: NEGATIVE
Poct Blood: NEGATIVE
Poct Glucose: NORMAL mg/dL
Poct Protein: NEGATIVE mg/dL
Poct Urobilinogen: NORMAL mg/dL
Spec Grav, UA: >=1.03 — AB (ref 1.010–1.025)
pH, UA: 6 (ref 5.0–8.0)

## 2020-03-01 NOTE — Progress Notes (Signed)
   SUBJECTIVE: Victoria Frye is a 19 y.o. female who complains of urinary frequency, urgency and dysuria x 7 days, without flank pain, fever, chills, or abnormal vaginal discharge or bleeding.   OBJECTIVE: Appears well, in no apparent distress.  Vital signs are normal. Urine dipstick shows positive for leukocytes and positive for ketones.    ASSESSMENT: Urine odor  PLAN: Treatment per orders.  Call or return to clinic prn if these symptoms worsen or fail to improve as anticipated.  Clovis Pu, RN

## 2020-03-05 LAB — URINE CULTURE

## 2020-03-07 ENCOUNTER — Encounter: Payer: Self-pay | Admitting: *Deleted

## 2020-03-07 ENCOUNTER — Telehealth: Payer: Self-pay | Admitting: *Deleted

## 2020-03-07 DIAGNOSIS — N390 Urinary tract infection, site not specified: Secondary | ICD-10-CM

## 2020-03-07 MED ORDER — SULFAMETHOXAZOLE-TRIMETHOPRIM 800-160 MG PO TABS: 1.0000 | ORAL_TABLET | Freq: Two times a day (BID) | ORAL | 0 refills | Status: AC

## 2020-03-07 NOTE — Telephone Encounter (Signed)
-----   Message from [name], [place] sent at 03/06/2020  2:04 PM EDT ----- UTI (+) with E. Coli. Please treat with Bactrim DS 160 mg BID x 3 days

## 2020-06-20 ENCOUNTER — Ambulatory Visit: Payer: Medicaid Other

## 2020-06-28 ENCOUNTER — Other Ambulatory Visit: Payer: Self-pay

## 2020-06-28 ENCOUNTER — Ambulatory Visit (INDEPENDENT_AMBULATORY_CARE_PROVIDER_SITE_OTHER): Payer: Medicaid Other | Admitting: *Deleted

## 2020-06-28 VITALS — BP 106/70 | HR 87 | Temp 97.7°F | Ht 61.0 in | Wt 95.8 lb

## 2020-06-28 DIAGNOSIS — Z3201 Encounter for pregnancy test, result positive: Secondary | ICD-10-CM

## 2020-06-28 DIAGNOSIS — Z3480 Encounter for supervision of other normal pregnancy, unspecified trimester: Secondary | ICD-10-CM

## 2020-06-28 LAB — POCT URINE PREGNANCY: Preg Test, Ur: POSITIVE — AB

## 2020-06-28 MED ORDER — BLOOD PRESSURE MONITOR AUTOMAT DEVI: 1.0000 | Freq: Every day | 0 refills | Status: AC

## 2020-06-28 MED ORDER — GOJJI WEIGHT SCALE MISC: 1.0000 | Freq: Every day | 0 refills | Status: AC | PRN

## 2020-06-28 NOTE — Progress Notes (Signed)
   Location: [facility] Patient: Victoria Frye MRN: [id number] Provider: Clovis Pu, RN  PRENATAL INTAKE SUMMARY  [name] presents today New OB Nurse Interview.  OB History    Gravida  4   Para  1   Term  1   Preterm      AB  1   Living  1     SAB  1   IAB      Ectopic      Multiple  0   Live Births  1          I have reviewed the patient's medical, obstetrical, social, and family histories, medications, and available lab results.  SUBJECTIVE She has no unusual complaints  OBJECTIVE Initial Nurse interview for history/labs (New OB)  EDD: 02/20/2021 by [facility]MP GA: [redacted]w[redacted]d [id number]  GENERAL APPEARANCE: alert, well appearing, in no apparent distress, oriented to person, place and time   ASSESSMENT Normal pregnancy  PLAN Prenatal care:  [facility] Labs to be completed at next visit with [name], CNM 08/03/2020 Ultrasound <14 wks ordered to confirm dating/viability Rx for BP monitor and weight scale sent to [facility] Continue PNV Patient to sign up for Babyscripts  Follow Up Instructions:   I discussed the assessment and treatment plan with the patient. The patient was provided an opportunity to ask questions and all were answered. The patient agreed with the plan and demonstrated an understanding of the instructions.   The patient was advised to call back or seek an in-person evaluation if the symptoms worsen or if the condition fails to improve as anticipated.  I provided 20 minutes of  face-to-face time during this encounter.  Clovis Pu, RN

## 2020-07-07 ENCOUNTER — Ambulatory Visit (INDEPENDENT_AMBULATORY_CARE_PROVIDER_SITE_OTHER): Payer: Medicaid Other | Admitting: *Deleted

## 2020-07-07 ENCOUNTER — Ambulatory Visit
Admission: RE | Admit: 2020-07-07 | Discharge: 2020-07-07 | Disposition: A | Discharge status: Home / Self Care | Payer: Medicaid Other | Source: Ambulatory Visit | Attending: Obstetrics and Gynecology | Admitting: Obstetrics and Gynecology

## 2020-07-07 ENCOUNTER — Other Ambulatory Visit: Payer: Self-pay | Admitting: Obstetrics and Gynecology

## 2020-07-07 ENCOUNTER — Other Ambulatory Visit: Payer: Self-pay

## 2020-07-07 DIAGNOSIS — Z3480 Encounter for supervision of other normal pregnancy, unspecified trimester: Secondary | ICD-10-CM | POA: Diagnosis present

## 2020-07-07 DIAGNOSIS — Z3A01 Less than 8 weeks gestation of pregnancy: Secondary | ICD-10-CM | POA: Diagnosis not present

## 2020-07-07 DIAGNOSIS — Z712 Person consulting for explanation of examination or test findings: Secondary | ICD-10-CM

## 2020-07-07 DIAGNOSIS — O208 Other hemorrhage in early pregnancy: Secondary | ICD-10-CM | POA: Diagnosis not present

## 2020-07-07 NOTE — Progress Notes (Signed)
Ultrasound results reviewed with Dr. Debroah Loop. Pt was informed of results showing viable intrauterine pregnancy measuring [redacted]w[redacted]d. She was also informed of presence small subchorionic hemorrhage and that this is not cause for worry. She may have some spotting which would be normal. If she has heavy bleeding like a period or strong abdominal pian, she should go to [facility]. Pt should keep appointment on 3/9 as scheduled. She voiced understanding of all information provided.

## 2020-07-08 NOTE — Progress Notes (Signed)
Patient ID: Victoria Frye, female   DOB: 04-Feb-2001, 20 y.o.   MRN: [id number] Patient was assessed and managed by nursing staff during this encounter. I have reviewed the chart and agree with the documentation and plan. I have also made any necessary editorial changes.  Scheryl Darter, MD 07/08/2020 10:42 AM

## 2020-08-03 ENCOUNTER — Other Ambulatory Visit (HOSPITAL_COMMUNITY)
Admission: RE | Admit: 2020-08-03 | Discharge: 2020-08-03 | Disposition: A | Discharge status: Home / Self Care | Payer: Medicaid Other | Source: Ambulatory Visit | Attending: Obstetrics and Gynecology | Admitting: Obstetrics and Gynecology

## 2020-08-03 ENCOUNTER — Encounter: Payer: Self-pay | Admitting: Obstetrics and Gynecology

## 2020-08-03 ENCOUNTER — Other Ambulatory Visit: Payer: Self-pay

## 2020-08-03 ENCOUNTER — Ambulatory Visit (INDEPENDENT_AMBULATORY_CARE_PROVIDER_SITE_OTHER): Payer: Medicaid Other | Admitting: Obstetrics and Gynecology

## 2020-08-03 VITALS — BP 107/72 | HR 116 | Temp 98.2°F | Wt 97.0 lb

## 2020-08-03 DIAGNOSIS — Z113 Encounter for screening for infections with a predominantly sexual mode of transmission: Secondary | ICD-10-CM | POA: Insufficient documentation

## 2020-08-03 DIAGNOSIS — O210 Mild hyperemesis gravidarum: Secondary | ICD-10-CM | POA: Diagnosis not present

## 2020-08-03 DIAGNOSIS — Z9189 Other specified personal risk factors, not elsewhere classified: Secondary | ICD-10-CM

## 2020-08-03 DIAGNOSIS — Z3481 Encounter for supervision of other normal pregnancy, first trimester: Secondary | ICD-10-CM | POA: Diagnosis not present

## 2020-08-03 DIAGNOSIS — Z124 Encounter for screening for malignant neoplasm of cervix: Secondary | ICD-10-CM

## 2020-08-03 DIAGNOSIS — Z3480 Encounter for supervision of other normal pregnancy, unspecified trimester: Secondary | ICD-10-CM

## 2020-08-03 DIAGNOSIS — Z31430 Encounter of female for testing for genetic disease carrier status for procreative management: Secondary | ICD-10-CM | POA: Diagnosis not present

## 2020-08-03 DIAGNOSIS — Z3A11 11 weeks gestation of pregnancy: Secondary | ICD-10-CM | POA: Diagnosis not present

## 2020-08-03 MED ORDER — DICLEGIS 10-10 MG PO TBEC: 2.0000 | DELAYED_RELEASE_TABLET | Freq: Every evening | ORAL | 3 refills | Status: DC | PRN

## 2020-08-03 MED ORDER — METOCLOPRAMIDE HCL 10 MG PO TABS: 10.0000 mg | ORAL_TABLET | Freq: Four times a day (QID) | ORAL | 0 refills | Status: DC

## 2020-08-03 NOTE — Progress Notes (Signed)
INITIAL OBSTETRICAL VISIT Patient name: Victoria Frye MRN [id number]  Date of birth: 2001-05-22 Chief Complaint:   Initial Prenatal Visit  History of Present Illness:   Victoria Frye is a 20 y.o. G25P1021 African American female at [redacted]w[redacted]d by LMP and 6 wk U/S with an Estimated Date of Delivery: 02/20/21 being seen today for her initial obstetrical visit.  Her obstetrical history is significant for anemia and chlamydia in her first pregnancy, depression and history of suicidal ideations in 2020 and in 2014. This is an unplanned pregnancy. She and the father of the baby (FOB) do not live together.  She reports this is different paternity than her first child.  She has a support system that consists of family/friends. Today she reports fatigue, nausea and vomiting.   Patient's last menstrual period was 05/16/2020. Last pap N/A. Results were: N/A due to age Review of Systems:   Pertinent items are noted in HPI Denies cramping/contractions, leakage of fluid, vaginal bleeding, abnormal vaginal discharge w/ itching/odor/irritation, headaches, visual changes, shortness of breath, chest pain, abdominal pain, severe nausea/vomiting, or problems with urination or bowel movements unless otherwise stated above.  Pertinent History Reviewed:  Reviewed past medical,surgical, social, obstetrical and family history.  Reviewed problem list, medications and allergies. OB History  Gravida Para Term Preterm AB Living  4 1 1   2 1   SAB IAB Ectopic Multiple Live Births  1     0 1    # Outcome Date GA Lbr Len/2nd Weight Sex Delivery Anes PTL Lv  4 Current           3 Term 06/28/18 [redacted]w[redacted]d 24:00 / 01:03 6 lb 15.8 oz (3.17 kg) M Vag-Spont EPI  LIV  2 SAB 04/27/17 [redacted]w[redacted]d         1 AB            Physical Assessment:   Vitals:   08/03/20 1509  BP: 107/72  Pulse: (!) 116  Temp: 98.2 F (36.8 C)  Weight: 97 lb (44 kg)  Body mass index is 18.33 kg/m.       Physical Examination:  General appearance - well  appearing, and in no distress  Mental status - alert, oriented to person, place, and time  Psych:  She has a normal mood and affect  Skin - warm and dry, normal color, no suspicious lesions noted  Chest - effort normal, all lung fields clear to auscultation bilaterally  Heart - normal rate and regular rhythm  Abdomen - soft, nontender  Extremities:  No swelling or varicosities noted  Pelvic - VULVA: normal appearing vulva with no masses, tenderness or lesions  VAGINA: normal appearing vagina with normal color and discharge, no lesions.   CERVIX: normal appearing cervix without discharge or lesions, no CMT  Thin prep pap is not done    FHTs by doppler: 169 bpm  Assessment & Plan:  1) Low-Risk Pregnancy 10/03/20 at [redacted]w[redacted]d with an Estimated Date of Delivery: 02/20/21   2) Initial OB visit - Welcomed to practice and introduced self to patient in addition to discussing other advanced practice providers that she may be seeing at this practice - Congratulated patient - Anticipatory guidance on upcoming appointments - Educated on COVID19 and pregnancy and the integration of virtual appointments  - Educated on babyscripts app- patient reports she has not received email, encouraged to look in spam folder and to call office if she still has not received email - patient verbalizes understanding    3) Supervision  of other normal pregnancy, antepartum - Cervicovaginal ancillary only( [place]) - Cytology - PAP( [place]) - CBC/D/Plt+RPR+Rh+ABO+Rub Ab... - Culture, OB Urine - Genetic Screening - [place] MFM OB COMP + 14 WK; Future  4) Screening for STDs (sexually transmitted diseases) - Cervicovaginal ancillary only( [place])  5) Pap smear for cervical cancer screening - Cytology - PAP( [place])   6) History of suicidal tendencies - Needs to Gwyndolyn Saxon, LCSW -We will closely monitor throughout pregnancy for signs of depression and suicidal ideations  7) [redacted] weeks gestation of  pregnancy     Meds:  Meds ordered this encounter  Medications  . DICLEGIS 10-10 MG TBEC    Sig: Take 2 tablets by mouth at bedtime as needed.    Dispense:  60 tablet    Refill:  3    Order Specific Question:   Supervising Provider    Answer:   Reva Bores [[id number]]  . metoCLOPramide (REGLAN) 10 MG tablet    Sig: Take 1 tablet (10 mg total) by mouth every 6 (six) hours.    Dispense:  60 tablet    Refill:  0    Order Specific Question:   Supervising Provider    Answer:   Reva Bores [[id number]]    Initial labs obtained Continue prenatal vitamins Reviewed n/v relief measures and warning s/s to report Reviewed recommended weight gain based on pre-gravid BMI Encouraged well-balanced diet Genetic Screening discussed: ordered Cystic fibrosis, SMA, Fragile X screening discussed ordered The nature of [place] [facility] with multiple MDs and other Advanced Practice Providers was explained to patient; also emphasized that residents, students are part of our team.  Discussed optimized OB schedule and video visits. Advised can have an in-office visit whenever she feels she needs to be seen.  Does not own BP cuff. BP cuff Rx faxed today. Explained to patient that BP will be mailed to her house. Check BP weekly, let us know if >140/90. Advised to call during normal business hours and there is an after-hours nurse line available.    Follow-up: Return in about 2 months (around 10/03/2020) for Return OB visit.   Orders Placed This Encounter  Procedures  . Culture, OB Urine  . Urine Culture, OB Reflex  . [place] MFM OB COMP + 14 WK  . CBC/D/Plt+RPR+Rh+ABO+Rub Ab...  . Genetic Screening  . Interpretation:  . Ab Scr+Antibody ID    [name] MSN, CNM 08/03/2020

## 2020-08-04 LAB — CERVICOVAGINAL ANCILLARY ONLY
Bacterial Vaginitis (gardnerella): NEGATIVE
Candida Glabrata: NEGATIVE
Candida Vaginitis: NEGATIVE
Chlamydia: NEGATIVE
Comment: NEGATIVE
Comment: NEGATIVE
Comment: NEGATIVE
Comment: NEGATIVE
Comment: NEGATIVE
Comment: NORMAL
Neisseria Gonorrhea: NEGATIVE
Trichomonas: NEGATIVE

## 2020-08-05 LAB — CBC/D/PLT+RPR+RH+ABO+RUB AB...
Basophils Absolute: 0 10*3/uL (ref 0.0–0.2)
Basos: 0 %
EOS (ABSOLUTE): 0.1 10*3/uL (ref 0.0–0.4)
Eos: 1 %
HCV Ab: <0.1 s/co ratio (ref 0.0–0.9)
HIV Screen 4th Generation wRfx: NONREACTIVE
Hematocrit: 35.7 % (ref 34.0–46.6)
Hemoglobin: 11.4 g/dL (ref 11.1–15.9)
Hepatitis B Surface Ag: NEGATIVE
Immature Grans (Abs): 0 10*3/uL (ref 0.0–0.1)
Immature Granulocytes: 0 %
Lymphocytes Absolute: 1.6 10*3/uL (ref 0.7–3.1)
Lymphs: 22 %
MCH: 26.3 pg — ABNORMAL LOW (ref 26.6–33.0)
MCHC: 31.9 g/dL (ref 31.5–35.7)
MCV: 82 fL (ref 79–97)
Monocytes Absolute: 0.6 10*3/uL (ref 0.1–0.9)
Monocytes: 8 %
Neutrophils Absolute: 5 10*3/uL (ref 1.4–7.0)
Neutrophils: 69 %
Platelets: 275 10*3/uL (ref 150–450)
RBC: 4.34 x10E6/uL (ref 3.77–5.28)
RDW: 14.6 % (ref 11.7–15.4)
RPR Ser Ql: NONREACTIVE
Rh Factor: POSITIVE
Rubella Antibodies, IGG: 7.47 index (ref >0.99)
WBC: 7.2 10*3/uL (ref 3.4–10.8)

## 2020-08-05 LAB — CULTURE, OB URINE

## 2020-08-05 LAB — AB SCR+ANTIBODY ID: Antibody Screen: POSITIVE — AB

## 2020-08-05 LAB — HCV INTERPRETATION

## 2020-08-05 LAB — URINE CULTURE, OB REFLEX: Organism ID, Bacteria: NO GROWTH

## 2020-08-06 ENCOUNTER — Encounter: Payer: Self-pay | Admitting: Obstetrics and Gynecology

## 2020-08-08 LAB — CYTOLOGY - PAP: Diagnosis: NEGATIVE

## 2020-08-09 ENCOUNTER — Encounter: Payer: Self-pay | Admitting: *Deleted

## 2020-08-29 ENCOUNTER — Other Ambulatory Visit: Payer: Self-pay | Admitting: Obstetrics and Gynecology

## 2020-08-29 DIAGNOSIS — O285 Abnormal chromosomal and genetic finding on antenatal screening of mother: Secondary | ICD-10-CM

## 2020-09-30 ENCOUNTER — Other Ambulatory Visit: Payer: Self-pay

## 2020-09-30 ENCOUNTER — Ambulatory Visit: Payer: Medicaid Other | Attending: Obstetrics and Gynecology

## 2020-09-30 ENCOUNTER — Other Ambulatory Visit: Payer: Self-pay | Admitting: Obstetrics and Gynecology

## 2020-09-30 DIAGNOSIS — Z3480 Encounter for supervision of other normal pregnancy, unspecified trimester: Secondary | ICD-10-CM | POA: Diagnosis not present

## 2020-10-06 ENCOUNTER — Telehealth: Payer: Medicaid Other | Admitting: Obstetrics and Gynecology

## 2020-11-03 ENCOUNTER — Telehealth (INDEPENDENT_AMBULATORY_CARE_PROVIDER_SITE_OTHER): Payer: Medicaid Other | Admitting: Obstetrics and Gynecology

## 2020-11-03 ENCOUNTER — Other Ambulatory Visit: Payer: Self-pay

## 2020-11-03 ENCOUNTER — Encounter: Payer: Self-pay | Admitting: Obstetrics and Gynecology

## 2020-11-03 VITALS — BP 92/46 | HR 96 | Wt 103.6 lb

## 2020-11-03 DIAGNOSIS — Z3480 Encounter for supervision of other normal pregnancy, unspecified trimester: Secondary | ICD-10-CM

## 2020-11-03 DIAGNOSIS — Z3482 Encounter for supervision of other normal pregnancy, second trimester: Secondary | ICD-10-CM

## 2020-11-03 DIAGNOSIS — Z3A24 24 weeks gestation of pregnancy: Secondary | ICD-10-CM

## 2020-11-03 NOTE — Progress Notes (Addendum)
    TELEHEALTH OBSTETRICS VISIT ENCOUNTER NOTE  Provider location: Center for [facility]   Patient location: Home  I connected with Victoria Frye on 11/03/20 at  1:50 PM EDT by telephone at home and verified that I am speaking with the correct person using two identifiers. Of note, unable to do video encounter due to technical difficulties.    I discussed the limitations, risks, security and privacy concerns of performing an evaluation and management service by telephone and the availability of in person appointments. I also discussed with the patient that there may be a patient responsible charge related to this service. The patient expressed understanding and agreed to proceed.  Subjective:  Victoria Frye is a 20 y.o. G4P1021 at [redacted]w[redacted]d being followed for ongoing prenatal care.  She is currently monitored for the following issues for this low-risk pregnancy and has MDD (major depressive disorder), recurrent episode, severe (HCC) and Supervision of other normal pregnancy, antepartum on their problem list.  Patient reports  pain at a pre-existing hernia site . She will plans to move to [place] at the end of the month. Reports fetal movement. Denies any contractions, bleeding or leaking of fluid.   The following portions of the patient's history were reviewed and updated as appropriate: allergies, current medications, past family history, past medical history, past social history, past surgical history and problem list.   Objective:  Blood pressure (!) 92/46, pulse 96, weight 103 lb 9.6 oz (47 kg), last menstrual period 05/16/2020. General:  Alert, oriented and cooperative.   Mental Status: Normal mood and affect perceived. Normal judgment and thought content.  Rest of physical exam deferred due to type of encounter  Assessment and Plan:  Pregnancy: G4P1021 at [redacted]w[redacted]d 1. Supervision of other normal pregnancy, antepartum - Advised if hernia enlarge to call the office -  may need general surgery consult  2. [redacted] weeks gestation of pregnancy   Preterm labor symptoms and general obstetric precautions including but not limited to vaginal bleeding, contractions, leaking of fluid and fetal movement were reviewed in detail with the patient.  I discussed the assessment and treatment plan with the patient. The patient was provided an opportunity to ask questions and all were answered. The patient agreed with the plan and demonstrated an understanding of the instructions. The patient was advised to call back or seek an in-person office evaluation/go to [facility] at [facility] for any urgent or concerning symptoms. Please refer to After Visit Summary for other counseling recommendations.   I provided 5 minutes of non-face-to-face time during this encounter. There was 5 minutes of chart review time spent prior to this encounter. Total time spent = 10 minutes.   Return in about 4 weeks (around 12/01/2020) for Return OB 2hr GTT.  Future Appointments  Date Time Provider Department Center  12/01/2020  8:10 AM [name], CNM [facility] None  12/29/2020  1:50 PM [name], CNM [facility] None    [name], CNM Center for [employer]

## 2020-12-01 ENCOUNTER — Encounter: Payer: Medicaid Other | Admitting: Obstetrics and Gynecology

## 2020-12-29 ENCOUNTER — Telehealth: Payer: Medicaid Other | Admitting: Obstetrics and Gynecology

## 2021-05-02 ENCOUNTER — Encounter (HOSPITAL_COMMUNITY): Payer: Self-pay

## 2021-05-02 ENCOUNTER — Emergency Department (HOSPITAL_COMMUNITY)
Admission: EM | Admit: 2021-05-02 | Discharge: 2021-05-03 | Disposition: A | Discharge status: Home / Self Care | Payer: Medicaid Other | Attending: Emergency Medicine | Admitting: Emergency Medicine

## 2021-05-02 DIAGNOSIS — B9689 Other specified bacterial agents as the cause of diseases classified elsewhere: Secondary | ICD-10-CM | POA: Insufficient documentation

## 2021-05-02 DIAGNOSIS — Z79899 Other long term (current) drug therapy: Secondary | ICD-10-CM | POA: Insufficient documentation

## 2021-05-02 DIAGNOSIS — R102 Pelvic and perineal pain: Secondary | ICD-10-CM | POA: Diagnosis present

## 2021-05-02 DIAGNOSIS — K219 Gastro-esophageal reflux disease without esophagitis: Secondary | ICD-10-CM | POA: Insufficient documentation

## 2021-05-02 DIAGNOSIS — N949 Unspecified condition associated with female genital organs and menstrual cycle: Secondary | ICD-10-CM

## 2021-05-02 DIAGNOSIS — N760 Acute vaginitis: Secondary | ICD-10-CM | POA: Diagnosis not present

## 2021-05-02 LAB — CBC WITH DIFFERENTIAL/PLATELET
Abs Immature Granulocytes: 0.01 10*3/uL (ref 0.00–0.07)
Basophils Absolute: 0 10*3/uL (ref 0.0–0.1)
Basophils Relative: 1 %
Eosinophils Absolute: 0.2 10*3/uL (ref 0.0–0.5)
Eosinophils Relative: 3 %
HCT: 36.5 % (ref 36.0–46.0)
Hemoglobin: 11.4 g/dL — ABNORMAL LOW (ref 12.0–15.0)
Immature Granulocytes: 0 %
Lymphocytes Relative: 27 %
Lymphs Abs: 1.7 10*3/uL (ref 0.7–4.0)
MCH: 25.9 pg — ABNORMAL LOW (ref 26.0–34.0)
MCHC: 31.2 g/dL (ref 30.0–36.0)
MCV: 83 fL (ref 80.0–100.0)
Monocytes Absolute: 0.4 10*3/uL (ref 0.1–1.0)
Monocytes Relative: 7 %
Neutro Abs: 3.8 10*3/uL (ref 1.7–7.7)
Neutrophils Relative %: 62 %
Platelets: 259 10*3/uL (ref 150–400)
RBC: 4.4 MIL/uL (ref 3.87–5.11)
RDW: 15.2 % (ref 11.5–15.5)
WBC: 6.1 10*3/uL (ref 4.0–10.5)
nRBC: 0 % (ref 0.0–0.2)

## 2021-05-02 LAB — URINALYSIS, ROUTINE W REFLEX MICROSCOPIC
Bacteria, UA: NONE SEEN
Bilirubin Urine: NEGATIVE
Glucose, UA: NEGATIVE mg/dL
Ketones, ur: NEGATIVE mg/dL
Nitrite: NEGATIVE
Specific Gravity, Urine: >1.03 — ABNORMAL HIGH (ref 1.005–1.030)
WBC, UA: >50 WBC/hpf — ABNORMAL HIGH (ref 0–5)
pH: 6 (ref 5.0–8.0)

## 2021-05-02 LAB — BASIC METABOLIC PANEL
Anion gap: 7 (ref 5–15)
BUN: 16 mg/dL (ref 6–20)
CO2: 26 mmol/L (ref 22–32)
Calcium: 9.3 mg/dL (ref 8.9–10.3)
Chloride: 106 mmol/L (ref 98–111)
Creatinine, Ser: 0.53 mg/dL (ref 0.44–1.00)
GFR, Estimated: >60 mL/min (ref >60)
Glucose, Bld: 106 mg/dL — ABNORMAL HIGH (ref 70–99)
Potassium: 3.3 mmol/L — ABNORMAL LOW (ref 3.5–5.1)
Sodium: 139 mmol/L (ref 135–145)

## 2021-05-02 LAB — I-STAT BETA HCG BLOOD, ED (MC, WL, AP ONLY): I-stat hCG, quantitative: <5 m[IU]/mL (ref <5)

## 2021-05-02 NOTE — ED Provider Notes (Signed)
[facility]-EMERGENCY DEPT Provider Note   CSN: [id number] Arrival date & time: 05/02/21  2014     History No chief complaint on file.   Victoria Frye is a 20 y.o. female.  The history is provided by the patient.  Victoria Frye is a 20 y.o. female who presents to the Emergency Department complaining of vaginal pain. She presents to the emergency department for evaluation of vaginally discomfort. She states that about one week ago she noticed a bump in the vaginal area. On Sunday she popped that pump. She did not noticed significant amount of drainage at that time. The next day she developed a watery vaginal discharge. She also has pain at the area of the bump if it comes in contact with liquid. She has associated dysuria. No fevers, nausea, vomiting, abdominal pain. No prior similar symptoms. No new sexual partners. She is two months postpartum following a routine vaginal delivery and she is currently breast-feeding. She does have a history of chlamydia in 2020, which was treated. No history of HSV infection.    Past Medical History:  Diagnosis Date   Anemia    Anemia affecting pregnancy in third trimester 04/11/2018   Chlamydia trachomatis infection in mother during third trimester of pregnancy 06/07/2018   GERD (gastroesophageal reflux disease)    Miscarriage within last 12 months    Normal labor 06/27/2018    Patient Active Problem List   Diagnosis Date Noted   Supervision of other normal pregnancy, antepartum 06/28/2020   MDD (major depressive disorder), recurrent episode, severe (HCC) 10/16/2018    Past Surgical History:  Procedure Laterality Date   COLON SURGERY     Pt has colon widened when she was [age] months old   DILATION AND EVACUATION N/A 07/08/2017   Procedure: DILATATION AND EVACUATION;  Surgeon: Adam Phenix, MD;  Location: [facility];  Service: Gynecology;  Laterality: N/A;     OB History     Gravida  4   Para  1   Term  1   Preterm       AB  2   Living  1      SAB  1   IAB      Ectopic      Multiple  0   Live Births  1           Family History  Problem Relation Age of Onset   Healthy Mother    Healthy Father     Social History   Tobacco Use   Smoking status: Never   Smokeless tobacco: Never  Vaping Use   Vaping Use: Never used  Substance Use Topics   Alcohol use: No   Drug use: Not Currently    Types: Marijuana    Comment: not since pregnant. Per pt stopped 08/27/17    Home Medications Prior to Admission medications   Medication Sig Start Date End Date Taking? Authorizing Provider  cephALEXin (KEFLEX) 500 MG capsule Take 1 capsule (500 mg total) by mouth 2 (two) times daily. 05/03/21  Yes Tilden Fossa, MD  metroNIDAZOLE (FLAGYL) 500 MG tablet Take 1 tablet (500 mg total) by mouth 2 (two) times daily. 05/03/21  Yes Tilden Fossa, MD  Blood Pressure Monitoring (BLOOD PRESSURE MONITOR AUTOMAT) DEVI 1 Device by Does not apply route daily. Automatic blood pressure cuff regular size. To monitor blood pressure regularly at home. ICD-10 code:Z34.90 06/28/20   [name], CNM  DICLEGIS 10-10 MG TBEC Take 2 tablets by mouth  at bedtime as needed. 08/03/20   [name], CNM  metoCLOPramide (REGLAN) 10 MG tablet Take 1 tablet (10 mg total) by mouth every 6 (six) hours. 08/03/20   [name], CNM  Misc. Devices (GOJJI WEIGHT SCALE) MISC 1 Device by Does not apply route daily as needed. To weight self daily as needed at home. ICD-10 code: Z34.90 06/28/20   [name], CNM    Allergies    Banana and Other  Review of Systems   Review of Systems  All other systems reviewed and are negative.  Physical Exam Updated Vital Signs BP 118/85   Pulse 86   Temp 98.4 F (36.9 C)   Resp 14   Ht 5' (1.524 m)   Wt 46.3 kg   LMP 05/16/2020   SpO2 99%   BMI 19.92 kg/m   Physical Exam Vitals and nursing note reviewed.  Constitutional:      Appearance: She is well-developed.  HENT:      Head: Normocephalic and atraumatic.  Cardiovascular:     Rate and Rhythm: Normal rate and regular rhythm.  Pulmonary:     Effort: Pulmonary effort is normal. No respiratory distress.  Abdominal:     Palpations: Abdomen is soft.     Tenderness: There is no abdominal tenderness. There is no guarding or rebound.  Genitourinary:    Comments: Copius yellow/green vaginal discharge.  No CMT.  There is swelling to the left labia with overlying ulceration.  No focal fluctuance.   Musculoskeletal:        General: No tenderness.  Skin:    General: Skin is warm and dry.  Neurological:     Mental Status: She is alert and oriented to person, place, and time.  Psychiatric:        Behavior: Behavior normal.    ED Results / Procedures / Treatments   Labs (all labs ordered are listed, but only abnormal results are displayed) Labs Reviewed  WET PREP, GENITAL - Abnormal; Notable for the following components:      Result Value   Clue Cells Wet Prep HPF POC PRESENT (*)    WBC, Wet Prep HPF POC >=10 (*)    All other components within normal limits  BASIC METABOLIC PANEL - Abnormal; Notable for the following components:   Potassium 3.3 (*)    Glucose, Bld 106 (*)    All other components within normal limits  CBC WITH DIFFERENTIAL/PLATELET - Abnormal; Notable for the following components:   Hemoglobin 11.4 (*)    MCH 25.9 (*)    All other components within normal limits  URINALYSIS, ROUTINE W REFLEX MICROSCOPIC - Abnormal; Notable for the following components:   APPearance CLOUDY (*)    Specific Gravity, Urine >1.030 (*)    Hgb urine dipstick TRACE (*)    Protein, ur TRACE (*)    Leukocytes,Ua TRACE (*)    WBC, UA >50 (*)    All other components within normal limits  I-STAT BETA HCG BLOOD, ED ([facility], [facility], [facility] ONLY)  GC/CHLAMYDIA PROBE AMP ([place]) NOT AT [facility]    EKG None  Radiology No results found.  Procedures Procedures   Medications Ordered in ED Medications  cefTRIAXone  (ROCEPHIN) injection 500 mg (has no administration in time range)  azithromycin (ZITHROMAX) tablet 1,000 mg (has no administration in time range)    ED Course  I have reviewed the triage vital signs and the nursing notes.  Pertinent labs & imaging results that were available during my care  of the patient were reviewed by me and considered in my medical decision making (see chart for details).    MDM Rules/Calculators/A&P                          Pt here for vaginal bump and discharge, currently breastfeeding.  She is nontoxic appearing on exam.  GU exam concerning for primary HSV infection vs spontaneously drained bartholin's abscess.  UA is concerning for UTI in setting of her dysuria.  She has significant discharge concerning for GC/chlamydia.  Exam is not c/w PID or TOA.  Discussed with pt concerns and recommendations.  Discussed with pharmacist regarding medication safety in breastfeeding.  Plan to d/c home with gyn follow up and return precautions.    Final Clinical Impression(s) / ED Diagnoses Final diagnoses:  BV (bacterial vaginosis)  Genital lesion, female    Rx / DC Orders ED Discharge Orders          Ordered    cephALEXin (KEFLEX) 500 MG capsule  2 times daily        05/03/21 0113    metroNIDAZOLE (FLAGYL) 500 MG tablet  2 times daily        05/03/21 0113             Tilden Fossa, MD 05/03/21 0120

## 2021-05-02 NOTE — ED Triage Notes (Signed)
Patient arrives from home with report of burning discharge and vaginal pain. Pt states she popped what looked like a boil in her vaginal area x 3 days ago, pt states since she has had continued discharge that is thin with a white and yellow tint. Pt also reports vaginal burning.

## 2021-05-02 NOTE — ED Provider Notes (Signed)
Emergency Medicine Provider Triage Evaluation Note  Victoria Frye , a 20 y.o. female  was evaluated in triage.  Pt complains of burning on her vulva.  She reports that about 3 days ago she popped something on the left labia and had pus come out.  She has had general burning now.  She reports that she is having increased vaginal discharge also.  No fevers.  She did have unprotected sex with a new partner recently.   Review of Systems  Positive: Discharge, vaginal sore   Negative: fever  Physical Exam  BP (!) 99/59 (BP Location: Right Arm)   Pulse (!) 103   Temp 98.4 F (36.9 C)   Resp 19   Ht 5' (1.524 m)   Wt 46.3 kg   LMP 05/16/2020   SpO2 100%   BMI 19.92 kg/m  Gen:   Awake, no distress   Resp:  Normal effort  MSK:   Moves extremities without difficulty  Other:  GU exam deferred.   Medical Decision Making  Medically screening exam initiated at 8:39 PM.  Appropriate orders placed.  Cleva Camero was informed that the remainder of the evaluation will be completed by another provider, this initial triage assessment does not replace that evaluation, and the importance of remaining in the ED until their evaluation is complete.  Note: Portions of this report may have been transcribed using voice recognition software. Every effort was made to ensure accuracy; however, inadvertent computerized transcription errors may be present    Norman Clay 05/02/21 2043    Gerhard Munch, MD 05/02/21 2302

## 2021-05-03 LAB — WET PREP, GENITAL
Sperm: NONE SEEN
Trich, Wet Prep: NONE SEEN
WBC, Wet Prep HPF POC: >=10 — AB (ref <10)
Yeast Wet Prep HPF POC: NONE SEEN

## 2021-05-03 MED ORDER — METRONIDAZOLE 500 MG PO TABS: 500.0000 mg | ORAL_TABLET | Freq: Two times a day (BID) | ORAL | 0 refills | Status: DC

## 2021-05-03 MED ORDER — CEPHALEXIN 500 MG PO CAPS: 500.0000 mg | ORAL_CAPSULE | Freq: Two times a day (BID) | ORAL | 0 refills | Status: DC

## 2021-05-03 MED ORDER — STERILE WATER FOR INJECTION IJ SOLN
INTRAMUSCULAR | Status: AC
  Filled 2021-05-03: qty 10

## 2021-05-03 MED ORDER — AZITHROMYCIN 250 MG PO TABS
1000.0000 mg | ORAL_TABLET | Freq: Once | ORAL | Status: AC
  Administered 2021-05-03: 1000 mg via ORAL
  Filled 2021-05-03: qty 4

## 2021-05-03 MED ORDER — VALACYCLOVIR HCL 1 G PO TABS: 1000.0000 mg | ORAL_TABLET | Freq: Two times a day (BID) | ORAL | 0 refills | Status: DC

## 2021-05-03 MED ORDER — CEFTRIAXONE SODIUM 1 G IJ SOLR
500.0000 mg | Freq: Once | INTRAMUSCULAR | Status: AC
  Administered 2021-05-03: 500 mg via INTRAMUSCULAR
  Filled 2021-05-03: qty 10

## 2021-05-04 ENCOUNTER — Telehealth: Payer: Self-pay

## 2021-05-04 LAB — GC/CHLAMYDIA PROBE AMP (~~LOC~~) NOT AT ARMC
Chlamydia: NEGATIVE
Comment: NEGATIVE
Comment: NORMAL
Neisseria Gonorrhea: NEGATIVE

## 2021-05-04 NOTE — Telephone Encounter (Signed)
Transition Care Management Unsuccessful Follow-up Telephone Call  Date of discharge and from where:  05/03/2021 from [facility]  Attempts:  1st Attempt  Reason for unsuccessful TCM follow-up call:  Left voice message

## 2021-05-05 NOTE — Telephone Encounter (Signed)
Transition Care Management Unsuccessful Follow-up Telephone Call  Date of discharge and from where:  05/03/2021-[place]  Attempts:  2nd Attempt  Reason for unsuccessful TCM follow-up call:  Left voice message

## 2021-05-08 NOTE — Telephone Encounter (Signed)
Transition Care Management Unsuccessful Follow-up Telephone Call  Date of discharge and from where:  05/03/2021 from [facility]  Attempts:  3rd Attempt  Reason for unsuccessful TCM follow-up call:  Unable to reach patient

## 2021-07-19 ENCOUNTER — Encounter (HOSPITAL_COMMUNITY): Payer: Self-pay

## 2021-07-19 ENCOUNTER — Emergency Department (HOSPITAL_COMMUNITY)
Admission: EM | Admit: 2021-07-19 | Discharge: 2021-07-19 | Disposition: A | Discharge status: Home / Self Care | Payer: Medicaid Other | Attending: Emergency Medicine | Admitting: Emergency Medicine

## 2021-07-19 ENCOUNTER — Other Ambulatory Visit: Payer: Self-pay

## 2021-07-19 DIAGNOSIS — K529 Noninfective gastroenteritis and colitis, unspecified: Secondary | ICD-10-CM | POA: Diagnosis not present

## 2021-07-19 DIAGNOSIS — Z20822 Contact with and (suspected) exposure to covid-19: Secondary | ICD-10-CM | POA: Insufficient documentation

## 2021-07-19 DIAGNOSIS — R000 Tachycardia, unspecified: Secondary | ICD-10-CM | POA: Insufficient documentation

## 2021-07-19 DIAGNOSIS — R197 Diarrhea, unspecified: Secondary | ICD-10-CM | POA: Diagnosis present

## 2021-07-19 LAB — URINALYSIS, ROUTINE W REFLEX MICROSCOPIC
Bacteria, UA: NONE SEEN
Bilirubin Urine: NEGATIVE
Glucose, UA: NEGATIVE mg/dL
Hgb urine dipstick: NEGATIVE
Ketones, ur: 80 mg/dL — AB
Nitrite: NEGATIVE
Protein, ur: NEGATIVE mg/dL
Specific Gravity, Urine: 1.031 — ABNORMAL HIGH (ref 1.005–1.030)
pH: 5 (ref 5.0–8.0)

## 2021-07-19 LAB — COMPREHENSIVE METABOLIC PANEL
ALT: 13 U/L (ref 0–44)
AST: 20 U/L (ref 15–41)
Albumin: 4.8 g/dL (ref 3.5–5.0)
Alkaline Phosphatase: 51 U/L (ref 38–126)
Anion gap: 7 (ref 5–15)
BUN: 16 mg/dL (ref 6–20)
CO2: 23 mmol/L (ref 22–32)
Calcium: 8.8 mg/dL — ABNORMAL LOW (ref 8.9–10.3)
Chloride: 106 mmol/L (ref 98–111)
Creatinine, Ser: 0.63 mg/dL (ref 0.44–1.00)
GFR, Estimated: >60 mL/min (ref >60)
Glucose, Bld: 88 mg/dL (ref 70–99)
Potassium: 3.5 mmol/L (ref 3.5–5.1)
Sodium: 136 mmol/L (ref 135–145)
Total Bilirubin: 1.8 mg/dL — ABNORMAL HIGH (ref 0.3–1.2)
Total Protein: 7.7 g/dL (ref 6.5–8.1)

## 2021-07-19 LAB — CBC
HCT: 38.6 % (ref 36.0–46.0)
Hemoglobin: 12.3 g/dL (ref 12.0–15.0)
MCH: 26.5 pg (ref 26.0–34.0)
MCHC: 31.9 g/dL (ref 30.0–36.0)
MCV: 83 fL (ref 80.0–100.0)
Platelets: 242 10*3/uL (ref 150–400)
RBC: 4.65 MIL/uL (ref 3.87–5.11)
RDW: 13.8 % (ref 11.5–15.5)
WBC: 5 10*3/uL (ref 4.0–10.5)
nRBC: 0 % (ref 0.0–0.2)

## 2021-07-19 LAB — RESP PANEL BY RT-PCR (FLU A&B, COVID) ARPGX2
Influenza A by PCR: NEGATIVE
Influenza B by PCR: NEGATIVE
SARS Coronavirus 2 by RT PCR: NEGATIVE

## 2021-07-19 LAB — LIPASE, BLOOD: Lipase: 29 U/L (ref 11–51)

## 2021-07-19 LAB — MAGNESIUM: Magnesium: 1.9 mg/dL (ref 1.7–2.4)

## 2021-07-19 LAB — I-STAT BETA HCG BLOOD, ED (MC, WL, AP ONLY): I-stat hCG, quantitative: <5 m[IU]/mL (ref <5)

## 2021-07-19 MED ORDER — ONDANSETRON 4 MG PO TBDP: 4.0000 mg | ORAL_TABLET | Freq: Three times a day (TID) | ORAL | 0 refills | Status: DC | PRN

## 2021-07-19 MED ORDER — ONDANSETRON HCL 4 MG/2ML IJ SOLN
4.0000 mg | Freq: Once | INTRAMUSCULAR | Status: AC
  Administered 2021-07-19: 4 mg via INTRAVENOUS
  Filled 2021-07-19: qty 2

## 2021-07-19 MED ORDER — ACETAMINOPHEN 325 MG PO TABS
650.0000 mg | ORAL_TABLET | Freq: Once | ORAL | Status: AC
  Administered 2021-07-19: 650 mg via ORAL
  Filled 2021-07-19: qty 2

## 2021-07-19 MED ORDER — LACTATED RINGERS IV BOLUS
1000.0000 mL | Freq: Once | INTRAVENOUS | Status: AC
  Administered 2021-07-19: 1000 mL via INTRAVENOUS

## 2021-07-19 NOTE — Discharge Instructions (Addendum)
If you develop worsening, continued, or recurrent abdominal pain, uncontrolled vomiting, fever, chest or back pain, or any other new/concerning symptoms then return to the ER for evaluation.  

## 2021-07-19 NOTE — ED Triage Notes (Signed)
Patient arrives from home with c/o of emesis and diarrhea since 10 am this morning. States she feels weak and had 1 episode of syncope earlier tonight.

## 2021-07-19 NOTE — ED Provider Notes (Signed)
[facility]-EMERGENCY DEPT Provider Note   CSN: [id number] Arrival date & time: 07/19/21  1939     History  Chief Complaint  Patient presents with   Emesis   Diarrhea    Danaly Bari is a 21 y.o. female.  HPI 21 year old female presents with vomiting and diarrhea.  Started this morning.  She is also accompanied by 2 kids who have subsequently developed similar symptoms.  Numerous episodes of vomiting and diarrhea and has been unable to keep down fluids.  She has also been having diffuse abdominal pain/cramping.  No fevers though she felt hot.  This afternoon/evening she was walking and her legs gave out from under her and she had to go down to the ground.  Did not pass out. No significant PMH.  Home Medications Prior to Admission medications   Medication Sig Start Date End Date Taking? Authorizing Provider  ondansetron (ZOFRAN-ODT) 4 MG disintegrating tablet Take 1 tablet (4 mg total) by mouth every 8 (eight) hours as needed for nausea or vomiting. 07/19/21  Yes Pricilla Loveless, MD  Blood Pressure Monitoring (BLOOD PRESSURE MONITOR AUTOMAT) DEVI 1 Device by Does not apply route daily. Automatic blood pressure cuff regular size. To monitor blood pressure regularly at home. ICD-10 code:Z34.90 06/28/20   [name], CNM  cephALEXin (KEFLEX) 500 MG capsule Take 1 capsule (500 mg total) by mouth 2 (two) times daily. 05/03/21   Tilden Fossa, MD  DICLEGIS 10-10 MG TBEC Take 2 tablets by mouth at bedtime as needed. 08/03/20   [name], CNM  metoCLOPramide (REGLAN) 10 MG tablet Take 1 tablet (10 mg total) by mouth every 6 (six) hours. 08/03/20   [name], CNM  metroNIDAZOLE (FLAGYL) 500 MG tablet Take 1 tablet (500 mg total) by mouth 2 (two) times daily. 05/03/21   Tilden Fossa, MD  Misc. Devices (GOJJI WEIGHT SCALE) MISC 1 Device by Does not apply route daily as needed. To weight self daily as needed at home. ICD-10 code: Z34.90 06/28/20   [name], CNM   valACYclovir (VALTREX) 1000 MG tablet Take 1 tablet (1,000 mg total) by mouth 2 (two) times daily. 05/03/21   Tilden Fossa, MD      Allergies    Banana and Other    Review of Systems   Review of Systems  Constitutional:  Negative for fever.  Gastrointestinal:  Positive for abdominal pain, diarrhea and vomiting. Negative for blood in stool.   Physical Exam Updated Vital Signs BP (!) 98/49    Pulse 94    Temp 98.9 F (37.2 C) (Oral)    Resp 20    SpO2 98%    Breastfeeding Unknown  Physical Exam Vitals and nursing note reviewed.  Constitutional:      General: She is not in acute distress.    Appearance: She is well-developed. She is not diaphoretic.  HENT:     Head: Normocephalic and atraumatic.  Cardiovascular:     Rate and Rhythm: Regular rhythm. Tachycardia present.     Heart sounds: Normal heart sounds.  Pulmonary:     Effort: Pulmonary effort is normal.     Breath sounds: Normal breath sounds.  Abdominal:     Palpations: Abdomen is soft.     Tenderness: There is generalized abdominal tenderness.  Skin:    General: Skin is warm and dry.  Neurological:     Mental Status: She is alert.    ED Results / Procedures / Treatments   Labs (all labs ordered are listed,  but only abnormal results are displayed) Labs Reviewed  COMPREHENSIVE METABOLIC PANEL - Abnormal; Notable for the following components:      Result Value   Calcium 8.8 (*)    Total Bilirubin 1.8 (*)    All other components within normal limits  URINALYSIS, ROUTINE W REFLEX MICROSCOPIC - Abnormal; Notable for the following components:   APPearance HAZY (*)    Specific Gravity, Urine 1.031 (*)    Ketones, ur 80 (*)    Leukocytes,Ua SMALL (*)    All other components within normal limits  RESP PANEL BY RT-PCR (FLU A&B, COVID) ARPGX2  LIPASE, BLOOD  CBC  MAGNESIUM  I-STAT BETA HCG BLOOD, ED ([facility], [facility], [facility] ONLY)    EKG EKG Interpretation  Date/Time:  Wednesday July 19 2021 20:34:22 EST Ventricular  Rate:  109 PR Interval:  127 QRS Duration: 65 QT Interval:  378 QTC Calculation: 509 R Axis:   65 Text Interpretation: Sinus tachycardia RSR' in V1 or V2, probably normal variant Nonspecific T abnormalities, anterior leads Prolonged QT interval Confirmed by Pricilla Loveless [phone]) on 07/19/2021 8:51:19 PM  Radiology No results found.  Procedures Procedures    Medications Ordered in ED Medications  lactated ringers bolus 1,000 mL (0 mLs Intravenous Stopped 07/19/21 2136)  acetaminophen (TYLENOL) tablet 650 mg (650 mg Oral Given 07/19/21 2026)  ondansetron (ZOFRAN) injection 4 mg (4 mg Intravenous Given 07/19/21 2026)    ED Course/ Medical Decision Making/ A&P                           Medical Decision Making Amount and/or Complexity of Data Reviewed Labs: ordered.  Risk OTC drugs. Prescription drug management.   Patient is initially pretty tachycardic into the 120s and feeling weak.  Thus labs were obtained and she was given IV fluids and IV Zofran.  She is feeling better from that perspective and has not had vomiting while in the emergency department.  Had some diffuse abdominal discomfort that is likely secondary to gastroenteritis.  Has 2 sick contacts at home as well.  Suspicion for abdominal emergency such as bowel obstruction, appendicitis, etc. is pretty low.  Labs were reviewed and interpreted by myself and her WBC is normal, kidney function is normal, lipase and LFTs are unremarkable as well.  Some ketones in the urine which go along with her had dehydration.  Of note, her blood pressures have been soft in the 90s but on chart review, including back in December 2022, she had similar blood pressures I think she chronically runs in this area.  Given she feels better I do not think she has any significant hypotension.  Will discharge home with a prescription for Zofran and she was given return precautions. Questionably long QTC on ECG, but this was probably tachycardia mediated, as  now her HR is better. No significant electrolyte disturbance.         Final Clinical Impression(s) / ED Diagnoses Final diagnoses:  Acute gastroenteritis    Rx / DC Orders ED Discharge Orders          Ordered    ondansetron (ZOFRAN-ODT) 4 MG disintegrating tablet  Every 8 hours PRN        07/19/21 2142              Pricilla Loveless, MD 07/19/21 2235

## 2021-07-20 ENCOUNTER — Telehealth: Payer: Self-pay

## 2021-07-20 NOTE — Telephone Encounter (Signed)
Transition Care Management Unsuccessful Follow-up Telephone Call  Date of discharge and from where:  07/19/2021 from [facility]  Attempts:  1st Attempt  Reason for unsuccessful TCM follow-up call:  Unable to leave message

## 2021-07-21 NOTE — Telephone Encounter (Signed)
Transition Care Management Unsuccessful Follow-up Telephone Call  Date of discharge and from where:  07/19/2021-[facility]  Attempts:  2nd Attempt  Reason for unsuccessful TCM follow-up call:  Unable to leave message

## 2021-07-24 NOTE — Telephone Encounter (Signed)
Transition Care Management Unsuccessful Follow-up Telephone Call  Date of discharge and from where:  07/19/2021-[facility]   Attempts:  3rd Attempt  Reason for unsuccessful TCM follow-up call:  Unable to leave message

## 2021-12-30 ENCOUNTER — Encounter (HOSPITAL_COMMUNITY): Payer: Self-pay | Admitting: Emergency Medicine

## 2021-12-30 ENCOUNTER — Emergency Department (HOSPITAL_COMMUNITY)
Admission: EM | Admit: 2021-12-30 | Discharge: 2021-12-30 | Disposition: A | Discharge status: Home / Self Care | Payer: Medicaid Other | Attending: Emergency Medicine | Admitting: Emergency Medicine

## 2021-12-30 DIAGNOSIS — N760 Acute vaginitis: Secondary | ICD-10-CM | POA: Insufficient documentation

## 2021-12-30 DIAGNOSIS — R102 Pelvic and perineal pain: Secondary | ICD-10-CM | POA: Diagnosis present

## 2021-12-30 DIAGNOSIS — L0291 Cutaneous abscess, unspecified: Secondary | ICD-10-CM

## 2021-12-30 DIAGNOSIS — N764 Abscess of vulva: Secondary | ICD-10-CM | POA: Diagnosis not present

## 2021-12-30 MED ORDER — DOXYCYCLINE HYCLATE 100 MG PO CAPS: 100.0000 mg | ORAL_CAPSULE | Freq: Two times a day (BID) | ORAL | 0 refills | Status: DC

## 2021-12-30 NOTE — Discharge Instructions (Signed)
Please return to the ED with any new or worsening symptoms such as fevers, nausea or vomiting, bodyaches or chills Please take antibiotics to completion.  You will take these antibiotics twice daily for the next 7 days.  Please be aware that his antibiotic does make you more susceptible to sunburns. Please read attached guide concerning skin abscesses

## 2021-12-30 NOTE — ED Provider Notes (Signed)
[facility]-EMERGENCY DEPT Provider Note   CSN: [id number] Arrival date & time: 12/30/21  5397     History No chief complaint on file.   Victoria Frye is a 21 y.o. female with medical history significant for anemia, GERD, chlamydia.  Patient presents to ED for evaluation of vaginal pain/swelling.  The patient states that 1 week ago she noticed minor swelling and irritation to the left side of her vaginal wall.  The patient reports that she is not soaking over the course of the last week but now this area is burning.  Patient denies any drainage from this area, denies any vaginal discharge.  The patient states she has a history of chlamydia infection that was successfully incompletely treated.  The patient denies any new sexual partners, concern for STI.  Patient denies any fevers, nausea, vomiting, dysuria, body aches or chills.  HPI     Home Medications Prior to Admission medications   Medication Sig Start Date End Date Taking? Authorizing Provider  doxycycline (VIBRAMYCIN) 100 MG capsule Take 1 capsule (100 mg total) by mouth 2 (two) times daily. 12/30/21  Yes Al Decant, PA-C  Blood Pressure Monitoring (BLOOD PRESSURE MONITOR AUTOMAT) DEVI 1 Device by Does not apply route daily. Automatic blood pressure cuff regular size. To monitor blood pressure regularly at home. ICD-10 code:Z34.90 06/28/20   [name], CNM  cephALEXin (KEFLEX) 500 MG capsule Take 1 capsule (500 mg total) by mouth 2 (two) times daily. 05/03/21   Tilden Fossa, MD  DICLEGIS 10-10 MG TBEC Take 2 tablets by mouth at bedtime as needed. 08/03/20   [name], CNM  metoCLOPramide (REGLAN) 10 MG tablet Take 1 tablet (10 mg total) by mouth every 6 (six) hours. 08/03/20   [name], CNM  metroNIDAZOLE (FLAGYL) 500 MG tablet Take 1 tablet (500 mg total) by mouth 2 (two) times daily. 05/03/21   Tilden Fossa, MD  Misc. Devices (GOJJI WEIGHT SCALE) MISC 1 Device by Does not apply route  daily as needed. To weight self daily as needed at home. ICD-10 code: Z34.90 06/28/20   [name], CNM  ondansetron (ZOFRAN-ODT) 4 MG disintegrating tablet Take 1 tablet (4 mg total) by mouth every 8 (eight) hours as needed for nausea or vomiting. 07/19/21   Pricilla Loveless, MD  valACYclovir (VALTREX) 1000 MG tablet Take 1 tablet (1,000 mg total) by mouth 2 (two) times daily. 05/03/21   Tilden Fossa, MD      Allergies    Banana and Other    Review of Systems   Review of Systems  Constitutional:  Negative for chills and fever.  Gastrointestinal:  Negative for nausea and vomiting.  Genitourinary:  Positive for genital sores. Negative for dysuria, vaginal discharge and vaginal pain.  All other systems reviewed and are negative.   Physical Exam Updated Vital Signs BP 112/62   Pulse 86   Temp 98.6 F (37 C) (Oral)   Resp 14   Ht 5' (1.524 m)   Wt 47.2 kg   LMP 12/30/2021   SpO2 99%   BMI 20.31 kg/m  Physical Exam Vitals and nursing note reviewed. Exam conducted with a chaperone present.  Constitutional:      General: She is not in acute distress.    Appearance: Normal appearance. She is not ill-appearing, toxic-appearing or diaphoretic.  HENT:     Head: Normocephalic and atraumatic.     Nose: Nose normal. No congestion.     Mouth/Throat:     Mouth: Mucous  membranes are moist.     Pharynx: Oropharynx is clear.  Eyes:     Extraocular Movements: Extraocular movements intact.     Conjunctiva/sclera: Conjunctivae normal.     Pupils: Pupils are equal, round, and reactive to light.  Cardiovascular:     Rate and Rhythm: Normal rate and regular rhythm.  Pulmonary:     Effort: Pulmonary effort is normal.     Breath sounds: Normal breath sounds. No wheezing.  Abdominal:     General: Abdomen is flat. Bowel sounds are normal.     Palpations: Abdomen is soft.     Tenderness: There is no abdominal tenderness.  Genitourinary:   Musculoskeletal:     Cervical back: Normal range  of motion and neck supple. No tenderness.  Skin:    General: Skin is warm and dry.     Capillary Refill: Capillary refill takes less than 2 seconds.  Neurological:     Mental Status: She is alert and oriented to person, place, and time.     ED Results / Procedures / Treatments   Labs (all labs ordered are listed, but only abnormal results are displayed) Labs Reviewed  URINALYSIS, ROUTINE W REFLEX MICROSCOPIC    EKG None  Radiology No results found.  Procedures Procedures   Medications Ordered in ED Medications - No data to display  ED Course/ Medical Decision Making/ A&P                           Medical Decision Making Amount and/or Complexity of Data Reviewed Labs: ordered.   21 year old female presents to ED for evaluation.  Please see HPI for further details.  On examination, the patient has 2 closed comedones to her left labia majora bandage, induration, fluctuance.  Differential diagnosis includes abscess versus HSV.  Patient denies any recent sexual partners, history of HSV.  The patient states that she has history of STI to include chlamydia that was successfully treated.  Patient area of irritation was ultrasounded and did not appear to show any appreciable fluid collections that are needing to be drained.  The patient will be placed on a 7-day course of doxycycline and given strict return precautions.  The patient was advised to follow-up with her OB/GYN for further management.  The patient was encouraged to return to the ED if she begins experiencing fevers, nausea or vomiting, body aches or chills.  The patient voiced understanding of these instructions.  The patient had all of her questions answered prior to discharge.  The patient is stable at this time for discharge home.  Final Clinical Impression(s) / ED Diagnoses Final diagnoses:  Abscess    Rx / DC Orders ED Discharge Orders          Ordered    doxycycline (VIBRAMYCIN) 100 MG capsule  2 times  daily        12/30/21 0950              Al Decant, PA-C 12/30/21 7628    Gloris Manchester, MD 01/01/22 1819

## 2021-12-30 NOTE — ED Triage Notes (Signed)
Pt endorse bump to her vagina for a week. Reports she has been soaking it but now it is burning. States it looks like a pimple.

## 2022-01-25 ENCOUNTER — Encounter (HOSPITAL_COMMUNITY): Payer: Self-pay

## 2022-01-25 ENCOUNTER — Emergency Department (HOSPITAL_COMMUNITY)
Admission: EM | Admit: 2022-01-25 | Discharge: 2022-01-25 | Disposition: A | Discharge status: Home / Self Care | Payer: Medicaid Other | Attending: Emergency Medicine | Admitting: Emergency Medicine

## 2022-01-25 ENCOUNTER — Other Ambulatory Visit: Payer: Self-pay

## 2022-01-25 DIAGNOSIS — N3001 Acute cystitis with hematuria: Secondary | ICD-10-CM | POA: Insufficient documentation

## 2022-01-25 DIAGNOSIS — R300 Dysuria: Secondary | ICD-10-CM | POA: Diagnosis present

## 2022-01-25 LAB — URINALYSIS, ROUTINE W REFLEX MICROSCOPIC
Bilirubin Urine: NEGATIVE
Glucose, UA: NEGATIVE mg/dL
Ketones, ur: 5 mg/dL — AB
Nitrite: NEGATIVE
Protein, ur: 30 mg/dL — AB
Specific Gravity, Urine: 1.029 (ref 1.005–1.030)
WBC, UA: >50 WBC/hpf — ABNORMAL HIGH (ref 0–5)
pH: 6 (ref 5.0–8.0)

## 2022-01-25 LAB — WET PREP, GENITAL
Sperm: NONE SEEN
Trich, Wet Prep: NONE SEEN
WBC, Wet Prep HPF POC: <10 (ref <10)
Yeast Wet Prep HPF POC: NONE SEEN

## 2022-01-25 LAB — PREGNANCY, URINE: Preg Test, Ur: NEGATIVE

## 2022-01-25 MED ORDER — NITROFURANTOIN MONOHYD MACRO 100 MG PO CAPS: 100.0000 mg | ORAL_CAPSULE | Freq: Two times a day (BID) | ORAL | 0 refills | Status: DC

## 2022-01-25 NOTE — ED Provider Notes (Signed)
[facility]-EMERGENCY DEPT Provider Note   CSN: [id number] Arrival date & time: 01/25/22  0453     History  Chief Complaint  Patient presents with   Hemorrhoids   Dysuria    Victoria Frye is a 21 y.o. female.   Dysuria Patient is a 21 year old female presenting to the room today with complaints of dysuria.  She states is been ongoing for several days.  She is not had any fevers no flank pain no nausea no vomiting.  She also has some questions about hemorrhoid treatment and she has self diagnosed hemorrhoid.  Offered to do an exam which she declined.  She denies any rectal pain apart from when she is passing a hard stool.  When questioned about STD risks she states she is not having vaginal discharge however then requested GC/chlamydia testing.  She denies any exposure however.      Home Medications Prior to Admission medications   Medication Sig Start Date End Date Taking? Authorizing Provider  nitrofurantoin, macrocrystal-monohydrate, (MACROBID) 100 MG capsule Take 1 capsule (100 mg total) by mouth 2 (two) times daily. 01/25/22  Yes Zaden Sako S, PA  Blood Pressure Monitoring (BLOOD PRESSURE MONITOR AUTOMAT) DEVI 1 Device by Does not apply route daily. Automatic blood pressure cuff regular size. To monitor blood pressure regularly at home. ICD-10 code:Z34.90 06/28/20   [name], CNM  DICLEGIS 10-10 MG TBEC Take 2 tablets by mouth at bedtime as needed. 08/03/20   [name], CNM  metoCLOPramide (REGLAN) 10 MG tablet Take 1 tablet (10 mg total) by mouth every 6 (six) hours. 08/03/20   [name], CNM  Misc. Devices (GOJJI WEIGHT SCALE) MISC 1 Device by Does not apply route daily as needed. To weight self daily as needed at home. ICD-10 code: Z34.90 06/28/20   [name], CNM  ondansetron (ZOFRAN-ODT) 4 MG disintegrating tablet Take 1 tablet (4 mg total) by mouth every 8 (eight) hours as needed for nausea or vomiting. 07/19/21   Pricilla Loveless,  MD  valACYclovir (VALTREX) 1000 MG tablet Take 1 tablet (1,000 mg total) by mouth 2 (two) times daily. 05/03/21   Tilden Fossa, MD      Allergies    Banana and Other    Review of Systems   Review of Systems  Genitourinary:  Positive for dysuria.    Physical Exam Updated Vital Signs BP 133/80 (BP Location: Left Arm)   Pulse 89   Temp 98.1 F (36.7 C) (Oral)   Resp 18   Ht 5' (1.524 m)   Wt 47.2 kg   LMP 12/30/2021 (Approximate)   SpO2 100%   BMI 20.31 kg/m  Physical Exam Vitals and nursing note reviewed.  Constitutional:      General: She is not in acute distress. HENT:     Head: Normocephalic and atraumatic.     Nose: Nose normal.  Eyes:     General: No scleral icterus. Cardiovascular:     Rate and Rhythm: Normal rate and regular rhythm.     Pulses: Normal pulses.     Heart sounds: Normal heart sounds.  Pulmonary:     Effort: Pulmonary effort is normal. No respiratory distress.     Breath sounds: No wheezing.  Abdominal:     Palpations: Abdomen is soft.     Tenderness: There is no abdominal tenderness.  Musculoskeletal:     Cervical back: Normal range of motion.     Right lower leg: No edema.     Left lower  leg: No edema.  Skin:    General: Skin is warm and dry.     Capillary Refill: Capillary refill takes less than 2 seconds.  Neurological:     Mental Status: She is alert. Mental status is at baseline.  Psychiatric:        Mood and Affect: Mood normal.        Behavior: Behavior normal.     ED Results / Procedures / Treatments   Labs (all labs ordered are listed, but only abnormal results are displayed) Labs Reviewed  URINALYSIS, ROUTINE W REFLEX MICROSCOPIC - Abnormal; Notable for the following components:      Result Value   APPearance CLOUDY (*)    Hgb urine dipstick MODERATE (*)    Ketones, ur 5 (*)    Protein, ur 30 (*)    Leukocytes,Ua LARGE (*)    WBC, UA >50 (*)    Bacteria, UA FEW (*)    All other components within normal limits   URINE CULTURE  WET PREP, GENITAL  PREGNANCY, URINE  GC/CHLAMYDIA PROBE AMP () NOT AT [facility]    EKG None  Radiology No results found.  Procedures Procedures    Medications Ordered in ED Medications - No data to display  ED Course/ Medical Decision Making/ A&P                           Medical Decision Making Amount and/or Complexity of Data Reviewed Labs: ordered.  Risk Prescription drug management.   Patient is a 20 year old female presenting to the room today with complaints of dysuria.  She states is been ongoing for several days.  She is not had any fevers no flank pain no nausea no vomiting.  She also has some questions about hemorrhoid treatment and she has self diagnosed hemorrhoid.  Offered to do an exam which she declined.  She denies any rectal pain apart from when she is passing a hard stool.  When questioned about STD risks she states she is not having vaginal discharge however then requested GC/chlamydia testing.  She denies any exposure however.    Physical exam is unremarkable.  No abdominal tenderness. Clue cells present on wet prep however she has not any vaginal discharge we will hold off on empiric treatment given that she has no vaginal symptoms.  Gonorrhea chlamydia pending this was obtained at her request by self swab.  She declined exam.  Urinalysis with bacteria, 50 WBC, positive for leukocytes.  Urine pregnancy negative.  Urine culture sent and pending.  We will discharge home with Macrobid.  Return precautions discussed.  I will otherwise she will follow-up with PCP.  We discussed sitz baths and stool softeners for her hemorrhoid although I did not personally evaluate this.   Final Clinical Impression(s) / ED Diagnoses Final diagnoses:  Acute cystitis with hematuria    Rx / DC Orders ED Discharge Orders          Ordered    nitrofurantoin, macrocrystal-monohydrate, (MACROBID) 100 MG capsule  2 times daily        01/25/22 0910               Gailen Shelter, PA 01/25/22 1028    Benjiman Core, MD 01/25/22 1529

## 2022-01-25 NOTE — Discharge Instructions (Addendum)
Take macrobid for your UTI.   GETTING TO GOOD BOWEL HEALTH. Irregular bowel habits such as constipation and diarrhea can lead to many problems over time.  Having one soft bowel movement a day is the most important way to prevent further problems.  The anorectal canal is designed to handle stretching and feces to safely manage our ability to get rid of solid waste (feces, poop, stool) out of our body.  BUT, hard constipated stools can act like ripping concrete bricks and diarrhea can be a burning fire to this very sensitive area of our body, causing inflamed hemorrhoids, anal fissures, increasing risk is perirectal abscesses, abdominal pain/bloating, an making irritable bowel worse.     The goal: ONE SOFT BOWEL MOVEMENT A DAY!  To have soft, regular bowel movements:  Drink at least 8 tall glasses of water a day.   Take plenty of fiber.  Fiber is the undigested part of plant food that passes into the colon, acting s "natures broom" to encourage bowel motility and movement.  Fiber can absorb and hold large amounts of water. This results in a larger, bulkier stool, which is soft and easier to pass. Work gradually over several weeks up to 6 servings a day of fiber (25g a day even more if needed) in the form of: Vegetables -- Root (potatoes, carrots, turnips), leafy green (lettuce, salad greens, celery, spinach), or cooked high residue (cabbage, broccoli, etc) Fruit -- Fresh (unpeeled skin & pulp), Dried (prunes, apricots, cherries, etc ),  or stewed ( applesauce)  Whole grain breads, pasta, etc (whole wheat)  Bran cereals  Bulking Agents -- This type of water-retaining fiber generally is easily obtained each day by one of the following:  Psyllium bran -- The psyllium plant is remarkable because its ground seeds can retain so much water. This product is available as Metamucil, Konsyl, Effersyllium, Per Diem Fiber, or the less expensive generic preparation in drug and health food stores. Although labeled a  laxative, it really is not a laxative.  Methylcellulose -- This is another fiber derived from wood which also retains water. It is available as Citrucel. Polyethylene Glycol - and "artificial" fiber commonly called Miralax or Glycolax.  It is helpful for people with gassy or bloated feelings with regular fiber Flax Seed - a less gassy fiber than psyllium No reading or other relaxing activity while on the toilet. If bowel movements take longer than 5 minutes, you are too constipated AVOID CONSTIPATION.  High fiber and water intake usually takes care of this.  Sometimes a laxative is needed to stimulate more frequent bowel movements, but  Laxatives are not a good long-term solution as it can wear the colon out. Osmotics (Milk of Magnesia, Fleets phosphosoda, Magnesium citrate, MiraLax, GoLytely) are safer than  Stimulants (Senokot, Castor Oil, Dulcolax, Ex Lax)    Do not take laxatives for more than 7days in a row.  IF SEVERELY CONSTIPATED, try a Bowel Retraining Program: Do not use laxatives.  Eat a diet high in roughage, such as bran cereals and leafy vegetables.  Drink six (6) ounces of prune or apricot juice each morning.  Eat two (2) large servings of stewed fruit each day.  Take one (1) heaping tablespoon of a psyllium-based bulking agent twice a day. Use sugar-free sweetener when possible to avoid excessive calories.  Eat a normal breakfast.  Set aside 15 minutes after breakfast to sit on the toilet, but do not strain to have a bowel movement.  If you do not  have a bowel movement by the third day, use an enema and repeat the above steps.  Controlling diarrhea Switch to liquids and simpler foods for a few days to avoid stressing your intestines further. Avoid dairy products (especially milk & ice cream) for a short time.  The intestines often can lose the ability to digest lactose when stressed. Avoid foods that cause gassiness or bloating.  Typical foods include beans and other legumes,  cabbage, broccoli, and dairy foods.  Every person has some sensitivity to other foods, so listen to our body and avoid those foods that trigger problems for you. Adding fiber (Citrucel, Metamucil, psyllium, Miralax) gradually can help thicken stools by absorbing excess fluid and retrain the intestines to act more normally.  Slowly increase the dose over a few weeks.  Too much fiber too soon can backfire and cause cramping & bloating. Probiotics (such as active yogurt, Align, etc) may help repopulate the intestines and colon with normal bacteria and calm down a sensitive digestive tract.  Most studies show it to be of mild help, though, and such products can be costly. Medicines: Bismuth subsalicylate (ex. Kayopectate, Pepto Bismol) every 30 minutes for up to 6 doses can help control diarrhea.  Avoid if pregnant. Loperamide (Immodium) can slow down diarrhea.  Start with two tablets (4mg  total) first and then try one tablet every 6 hours.  Avoid if you are having fevers or severe pain.  If you are not better or start feeling worse, stop all medicines and call your doctor for advice Call your doctor if you are getting worse or not better.  Sometimes further testing (cultures, endoscopy, X-ray studies, bloodwork, etc) may be needed to help diagnose and treat the cause of the diarrhea.  Managing Pain  Pain after surgery or related to activity is often due to strain/injury to muscle, tendon, nerves and/or incisions.  This pain is usually short-term and will improve in a few months.   Many people find it helpful to do the following things TOGETHER to help speed the process of healing and to get back to regular activity more quickly:  Avoid heavy physical activity  no lifting greater than 20 pounds Do not "push through" the pain.  Listen to your body and avoid positions and maneuvers than reproduce the pain Walking is okay as tolerated, but go slowly and stop when getting sore.  Remember: If it hurts to do  it, then don't do it! Take Anti-inflammatory medication  Take with food/snack around the clock for 1-2 weeks This helps the muscle and nerve tissues become less irritable and calm down faster Choose ONE of the following over-the-counter medications: Naproxen 220mg  tabs (ex. Aleve) 1-2 pills twice a day  Ibuprofen 200mg  tabs (ex. Advil, Motrin) 3-4 pills with every meal and just before bedtime Acetaminophen 500mg  tabs (Tylenol) 1-2 pills with every meal and just before bedtime Use a Heating pad or Ice/Cold Pack 4-6 times a day May use warm bath/hottub  or showers Try Gentle Massage and/or Stretching  at the area of pain many times a day stop if you feel pain - do not overdo it  Try these steps together to help you body heal faster and avoid making things get worse.  Doing just one of these things may not be enough.    If you are not getting better after two weeks or are noticing you are getting worse, contact our office for further advice; we may need to re-evaluate you & see what other things  we can do to help.

## 2022-01-25 NOTE — ED Triage Notes (Signed)
Sts she has 3 issues:   1) vaginal spotting x 2 weeks.   2) suspected internal hemorrhoid. Describes painful BM, itching and burning sensation.   3) an uncomfortable sensation when urinating.

## 2022-01-26 LAB — URINE CULTURE: Culture: <10000 — AB

## 2022-01-26 LAB — GC/CHLAMYDIA PROBE AMP (~~LOC~~) NOT AT ARMC
Chlamydia: POSITIVE — AB
Comment: NEGATIVE
Comment: NORMAL
Neisseria Gonorrhea: NEGATIVE

## 2022-03-06 ENCOUNTER — Ambulatory Visit
Admission: EM | Admit: 2022-03-06 | Discharge: 2022-03-06 | Disposition: A | Discharge status: Home / Self Care | Payer: Medicaid Other | Attending: Emergency Medicine | Admitting: Emergency Medicine

## 2022-03-06 DIAGNOSIS — Z113 Encounter for screening for infections with a predominantly sexual mode of transmission: Secondary | ICD-10-CM | POA: Insufficient documentation

## 2022-03-06 DIAGNOSIS — N898 Other specified noninflammatory disorders of vagina: Secondary | ICD-10-CM | POA: Diagnosis not present

## 2022-03-06 NOTE — ED Provider Notes (Signed)
HPI  SUBJECTIVE:  Victoria Frye is a 21 y.o. female who presents with 1.5 weeks of clear, nonodorous vaginal discharge starting before menses.  It did not change or decrease after finishing menses.  Patient denies urinary complaints, genital rash, itching, swelling, abdominal, back, pelvic pain.  She is in a new relationship with a female, who is asymptomatic.  She denies having other partners, is not sure about him.  STDs are a concern today.  No antibiotics in the past month.  She tried sits baths with improvement in her symptoms.  No aggravating factors.  She had chlamydia over 3 months ago, has a history of frequent BV.  No history of gonorrhea, HIV, HSV, syphilis, trichomonas, yeast infections, diabetes.  LMP: Last week.  Denies the possibility being pregnant.  PCP: None.    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 Per pt stopped 08/27/17    No current facility-administered medications for this encounter.  Current Outpatient Medications:    Blood Pressure Monitoring (BLOOD PRESSURE MONITOR AUTOMAT) DEVI, 1  Device by Does not apply route daily. Automatic blood pressure cuff regular size. To monitor blood pressure regularly at home. ICD-10 code:Z34.90, Disp: 1 each, Rfl: 0   Misc. Devices (GOJJI WEIGHT SCALE) MISC, 1 Device by Does not apply route daily as needed. To weight self daily as needed at home. ICD-10 code: Z34.90, Disp: 1 each, Rfl: 0   valACYclovir (VALTREX) 1000 MG tablet, Take 1 tablet (1,000 mg total) by mouth 2 (two) times daily., Disp: 14 tablet, Rfl: 0  Allergies  Allergen Reactions   Banana Anaphylaxis and Itching   Other Itching    Onions     ROS  As noted in HPI.   Physical Exam  BP (!) 104/56 (BP Location: Left Arm)   Pulse 82   Temp 98.4 F (36.9 C) (Oral)   Resp 16   LMP 03/01/2022 (Approximate)   SpO2 97%   Breastfeeding No   Constitutional: Well developed, well nourished, no acute distress Eyes:  EOMI, conjunctiva normal bilaterally HENT: Normocephalic, atraumatic,mucus membranes moist Respiratory: Normal inspiratory effort Cardiovascular: Normal rate GI: nondistended soft, nontender. No suprapubic tenderness  back: No CVA tenderness GU: Deferred skin: No rash, skin intact Musculoskeletal: no deformities Neurologic: Alert & oriented x 3, no focal neuro deficits Psychiatric: Speech and behavior appropriate   ED Course   Medications - No data to display  Orders Placed This Encounter  Procedures   Nursing Communication Please  set up with a PCP prior to discharge    Please set up with a PCP prior to discharge    Standing Status:   Standing    Number of Occurrences:   1    No results found for this or any previous visit (from the past 24 hour(s)). No results found.  ED Clinical Impression  1. Vaginal discharge   2. Screening for STDs (sexually transmitted diseases)     ED Assessment/Plan    Patient is concerned about STDs.  Sending off vaginal swab for STIs, BV, yeast.  She states that this is not similar to previous episodes of BV.   Will not treat empirically now for STIs at this point in time, or send home with Flagyl or Diflucan today as she states this is not similar to previous episodes of BV and it does not sound like a yeast infection.  Will target treatment based on lab results.  Patient is amenable to this.  Advised pt to refrain from sexual contact until she knows lab results, symptoms resolve, and partner(s) are treated if necessary. Pt provided working phone number. Follow-up with PCP of choice as needed. Discussed labs, MDM, plan and followup with patient. Pt agrees with plan.   No orders of the defined types were placed in this encounter.   *This clinic note was created using Dragon dictation software. Therefore, there may be occasional mistakes despite careful proofreading.  ?     Melynda Ripple, MD 03/06/22 1014

## 2022-03-06 NOTE — ED Triage Notes (Signed)
Patient presents to [facility] for vaginal discharge x 1.5 weeks ago. States she noted it before the start of her cycle. Hx of BV.   Denies urinary symptoms.

## 2022-03-06 NOTE — Discharge Instructions (Addendum)
We will call in the appropriate medication based on your lab results.  Give [place] a working phone number so that we can contact you if needed. Refrain from sexual contact until all of your labs have come back, symptoms have resolved, and your partner(s) are treated if necessary. Return to the ER if you get worse, have a fever >100.4, or for any concerns.   Go to www.goodrx.com  or www.costplusdrugs.com to look up your medications. This will give you a list of where you can find your prescriptions at the most affordable prices. Or ask the pharmacist what the cash price is, or if they have any other discount programs available to help make your medication more affordable. This can be less expensive than what you would pay with insurance.

## 2022-03-07 ENCOUNTER — Telehealth (HOSPITAL_COMMUNITY): Payer: Self-pay | Admitting: Emergency Medicine

## 2022-03-07 LAB — CERVICOVAGINAL ANCILLARY ONLY
Bacterial Vaginitis (gardnerella): POSITIVE — AB
Candida Glabrata: NEGATIVE
Candida Vaginitis: NEGATIVE
Chlamydia: NEGATIVE
Comment: NEGATIVE
Comment: NEGATIVE
Comment: NEGATIVE
Comment: NEGATIVE
Comment: NEGATIVE
Comment: NORMAL
Neisseria Gonorrhea: NEGATIVE
Trichomonas: NEGATIVE

## 2022-03-07 MED ORDER — METRONIDAZOLE 500 MG PO TABS: 500.0000 mg | ORAL_TABLET | Freq: Two times a day (BID) | ORAL | 0 refills | Status: DC

## 2022-05-31 ENCOUNTER — Emergency Department (HOSPITAL_COMMUNITY)
Admission: EM | Admit: 2022-05-31 | Discharge: 2022-05-31 | Disposition: A | Discharge status: Home / Self Care | Payer: Medicaid Other | Attending: Emergency Medicine | Admitting: Emergency Medicine

## 2022-05-31 ENCOUNTER — Other Ambulatory Visit: Payer: Self-pay

## 2022-05-31 ENCOUNTER — Encounter (HOSPITAL_COMMUNITY): Payer: Self-pay

## 2022-05-31 ENCOUNTER — Emergency Department (HOSPITAL_COMMUNITY): Payer: Medicaid Other

## 2022-05-31 DIAGNOSIS — J101 Influenza due to other identified influenza virus with other respiratory manifestations: Secondary | ICD-10-CM | POA: Insufficient documentation

## 2022-05-31 DIAGNOSIS — Z1152 Encounter for screening for COVID-19: Secondary | ICD-10-CM | POA: Insufficient documentation

## 2022-05-31 DIAGNOSIS — R000 Tachycardia, unspecified: Secondary | ICD-10-CM | POA: Insufficient documentation

## 2022-05-31 DIAGNOSIS — R059 Cough, unspecified: Secondary | ICD-10-CM | POA: Diagnosis present

## 2022-05-31 DIAGNOSIS — R0602 Shortness of breath: Secondary | ICD-10-CM | POA: Diagnosis not present

## 2022-05-31 LAB — RESP PANEL BY RT-PCR (RSV, FLU A&B, COVID)  RVPGX2
Influenza A by PCR: POSITIVE — AB
Influenza B by PCR: NEGATIVE
Resp Syncytial Virus by PCR: NEGATIVE
SARS Coronavirus 2 by RT PCR: NEGATIVE

## 2022-05-31 MED ORDER — IBUPROFEN 200 MG PO TABS
400.0000 mg | ORAL_TABLET | Freq: Once | ORAL | Status: AC
  Administered 2022-05-31: 400 mg via ORAL
  Filled 2022-05-31: qty 2

## 2022-05-31 MED ORDER — ACETAMINOPHEN 325 MG PO TABS
650.0000 mg | ORAL_TABLET | Freq: Once | ORAL | Status: AC
  Administered 2022-05-31: 650 mg via ORAL
  Filled 2022-05-31: qty 2

## 2022-05-31 MED ORDER — ONDANSETRON HCL 4 MG PO TABS: 4.0000 mg | ORAL_TABLET | Freq: Four times a day (QID) | ORAL | 0 refills | Status: DC

## 2022-05-31 MED ORDER — ONDANSETRON 4 MG PO TBDP
4.0000 mg | ORAL_TABLET | Freq: Once | ORAL | Status: AC
  Administered 2022-05-31: 4 mg via ORAL
  Filled 2022-05-31: qty 1

## 2022-05-31 MED ORDER — BENZONATATE 100 MG PO CAPS: 100.0000 mg | ORAL_CAPSULE | Freq: Three times a day (TID) | ORAL | 0 refills | Status: DC

## 2022-05-31 NOTE — ED Provider Triage Note (Signed)
Emergency Medicine Provider Triage Evaluation Note  Shelita Steptoe , a 22 y.o. female  was evaluated in triage.  Pt complains of bodyaches, runny nose, back pain, nausea, vomiting, diarrhea in the last 2 days.  Patient reports her son is also sick at home.  She reports fever of 101.  She reports taking Tylenol and TheraFlu with no improvement.  Review of Systems  Positive: As above Negative: As above  Physical Exam  BP 100/76 (BP Location: Right Arm)   Pulse (!) 106   Temp 100 F (37.8 C) (Oral)   Resp 16   SpO2 100%  Gen:   Awake, no distress   Resp:  Normal effort  MSK:   Moves extremities without difficulty  Other:    Medical Decision Making  Medically screening exam initiated at 11:57 AM.  Appropriate orders placed.  Jimya Ciani was informed that the remainder of the evaluation will be completed by another provider, this initial triage assessment does not replace that evaluation, and the importance of remaining in the ED until their evaluation is complete.     Rex Kras, [place] 05/31/22 1724

## 2022-05-31 NOTE — ED Provider Notes (Signed)
[place] DEPT Provider Note   CSN: [id number] Arrival date & time: 05/31/22  1113     History  Chief Complaint  Patient presents with   Generalized Body Aches    Victoria Frye is a 22 y.o. female.  Patient is a 22 year old female with no significant past medical history presenting to the emergency department with 2 days of cough, congestion and bodyaches.  She states that last night she had nausea with 1 episode of vomiting and diarrhea.  She denies any black or bloody stools.  She states that her cough is nonproductive.  She denies any associated chest pain or shortness of breath or abdominal pain.  She states that several family members have been sick at home.  The history is provided by the patient.       Home Medications Prior to Admission medications   Medication Sig Start Date End Date Taking? Authorizing Provider  benzonatate (TESSALON) 100 MG capsule Take 1 capsule (100 mg total) by mouth every 8 (eight) hours. 05/31/22  Yes Maylon Peppers, [place] K, DO  ondansetron (ZOFRAN) 4 MG tablet Take 1 tablet (4 mg total) by mouth every 6 (six) hours. 05/31/22  Yes Leanord Asal K, DO  Blood Pressure Monitoring (BLOOD PRESSURE MONITOR AUTOMAT) DEVI 1 Device by Does not apply route daily. Automatic blood pressure cuff regular size. To monitor blood pressure regularly at home. ICD-10 code:Z34.90 06/28/20   Laury Deep, CNM  metroNIDAZOLE (FLAGYL) 500 MG tablet Take 1 tablet (500 mg total) by mouth 2 (two) times daily. 03/07/22   Lamptey, Myrene Galas, MD  Misc. Devices (GOJJI WEIGHT SCALE) MISC 1 Device by Does not apply route daily as needed. To weight self daily as needed at home. ICD-10 code: Z34.90 06/28/20   Laury Deep, CNM  valACYclovir (VALTREX) 1000 MG tablet Take 1 tablet (1,000 mg total) by mouth 2 (two) times daily. 05/03/21   Quintella Reichert, MD      Allergies    Banana and Other    Review of Systems   Review of Systems  Physical  Exam Updated Vital Signs BP 103/88 (BP Location: Right Arm)   Pulse (!) 120   Temp 99.9 F (37.7 C) (Oral)   Resp 17   LMP 05/23/2022 (Approximate)   SpO2 99%  Physical Exam Vitals and nursing note reviewed.  Constitutional:      General: She is not in acute distress.    Appearance: Normal appearance.  HENT:     Head: Normocephalic and atraumatic.     Nose: Congestion present.     Mouth/Throat:     Mouth: Mucous membranes are moist.     Pharynx: Oropharynx is clear.  Eyes:     Extraocular Movements: Extraocular movements intact.     Conjunctiva/sclera: Conjunctivae normal.  Cardiovascular:     Rate and Rhythm: Regular rhythm. Tachycardia present.     Heart sounds: Normal heart sounds.  Pulmonary:     Effort: Pulmonary effort is normal.     Breath sounds: Normal breath sounds.  Abdominal:     General: Abdomen is flat.     Palpations: Abdomen is soft.     Tenderness: There is no abdominal tenderness.  Musculoskeletal:        General: Normal range of motion.     Cervical back: Normal range of motion and neck supple.  Skin:    General: Skin is warm and dry.  Neurological:     General: No focal deficit present.  Mental Status: She is alert.  Psychiatric:        Mood and Affect: Mood normal.        Behavior: Behavior normal.     ED Results / Procedures / Treatments   Labs (all labs ordered are listed, but only abnormal results are displayed) Labs Reviewed  RESP PANEL BY RT-PCR (RSV, FLU A&B, COVID)  [id number] - Abnormal; Notable for the following components:      Result Value   Influenza A by PCR POSITIVE (*)    All other components within normal limits    EKG None  Radiology DG Chest 2 View  Result Date: 05/31/2022 CLINICAL DATA:  Shortness of breath.  Body aches. EXAM: CHEST - 2 VIEW COMPARISON:  None Available. FINDINGS: Cardiac silhouette and mediastinal contours are within normal limits. The lungs are clear. No pleural effusion or pneumothorax. Minimal  levocurvature centered at T5-6. IMPRESSION: No active cardiopulmonary disease. Electronically Signed   By: Neita Garnet M.D.   On: 05/31/2022 12:25    Procedures Procedures    Medications Ordered in ED Medications  ibuprofen (ADVIL) tablet 400 mg (400 mg Oral Given 05/31/22 1610)  acetaminophen (TYLENOL) tablet 650 mg (650 mg Oral Given 05/31/22 1610)  ondansetron (ZOFRAN-ODT) disintegrating tablet 4 mg (4 mg Oral Given 05/31/22 1610)    ED Course/ Medical Decision Making/ A&P                           Medical Decision Making This patient presents to the ED with chief complaint(s) of body aches, cough, congestion with no pertinent past medical history which further complicates the presenting complaint. The complaint involves an extensive differential diagnosis and also carries with it a high risk of complications and morbidity.    The differential diagnosis includes viral syndrome, patient has no focal lung sounds and is satting well on room air making pneumonia unlikely, patient's uvula is midline is no trismus is tolerating secretions and has normal phonation making RPA, PTA or Ludwig's unlikely, patient has no signs of severe dehydration on exam   Additional history obtained: Additional history obtained from N/a Records reviewed n/a  ED Course and Reassessment: Patient was initially evaluated by provider in triage and had viral swab performed as well as chest x-ray.  Patient tested positive for influenza A which is likely the cause of her symptoms and chest x-ray was without acute disease.  Is the patient has some no significant comorbidities, she was not recommended Tamiflu and aws recommended Tylenol and Motrin further fevers and bodyaches as well as given Zofran for nausea.  I recommended primary care follow-up and were given strict return precautions.  Independent labs interpretation:  The following labs were independently interpreted: influenza A positive  Independent visualization  of imaging: - I independently visualized the following imaging with scope of interpretation limited to determining acute life threatening conditions related to emergency care: CXR, which revealed no acute disease  Consultation: - Consulted or discussed management/test interpretation w/ external professional: N/A  Consideration for admission or further workup: Patient has no emergent conditions requiring admission or further work-up at this time and is stable for discharge home with primary care follow-up  Social Determinants of health: N/A     Risk OTC drugs. Prescription drug management.          Final Clinical Impression(s) / ED Diagnoses Final diagnoses:  Influenza A    Rx / DC Orders ED Discharge Orders  Ordered    ondansetron (ZOFRAN) 4 MG tablet  Every 6 hours        05/31/22 1621    benzonatate (TESSALON) 100 MG capsule  Every 8 hours        05/31/22 1621              Leanord Asal K, DO 05/31/22 1622

## 2022-05-31 NOTE — ED Triage Notes (Addendum)
Patient said for 2 days her body has been aching. Her nose is stopped up. Whole family sick. Back pain. Victoria Frye it hurts to breathe. Vomiting and diarrhea.

## 2022-05-31 NOTE — Discharge Instructions (Signed)
You were seen in the emergency department for your body aches, cough and congestion.  You tested positive for influenza A.  You did not require treatment with any antiviral medications can continue symptomatic treatment with Tylenol and Motrin as needed for fevers and bodyaches and both can be taken up to every 6 hours.  I have given you prescription for Zofran that you can take as needed for nausea.  You can try Mucinex or raw honey as needed for your cough and you can use a humidifier or hot steam from the shower to help with your congestion as well as over-the-counter and nasal decongestant sprays.  You can follow-up with your primary doctor in the next few days to have your symptoms rechecked.  You should return to the emergency department for significantly worsening shortness of breath, severe chest pain, repetitive vomiting or if you have any other new or concerning symptoms.

## 2022-07-09 ENCOUNTER — Ambulatory Visit
Admission: EM | Admit: 2022-07-09 | Discharge: 2022-07-09 | Disposition: A | Discharge status: Home / Self Care | Payer: Medicaid Other | Attending: Urgent Care | Admitting: Urgent Care

## 2022-07-09 DIAGNOSIS — N760 Acute vaginitis: Secondary | ICD-10-CM | POA: Diagnosis not present

## 2022-07-09 DIAGNOSIS — B9689 Other specified bacterial agents as the cause of diseases classified elsewhere: Secondary | ICD-10-CM

## 2022-07-09 LAB — POCT URINE PREGNANCY: Preg Test, Ur: NEGATIVE

## 2022-07-09 NOTE — ED Provider Notes (Signed)
[facility]  Note:  This document was prepared using Systems analyst and may include unintentional dictation errors.  MRN: [id number] DOB: June 01, 2000  Subjective:   Victoria Frye is a 22 y.o. female presenting for 2-day history of acute onset recurrent significant vaginal discharge.  LMP was 06/18/2022.  Patient has unprotected sex, no contraception.  Has 1 female partner.  Denies fever, n/v, abdominal pain, pelvic pain, rashes, dysuria, urinary frequency, hematuria.  Would like to be checked for STIs, BV and yeast.  No current facility-administered medications for this encounter.  Current Outpatient Medications:    benzonatate (TESSALON) 100 MG capsule, Take 1 capsule (100 mg total) by mouth every 8 (eight) hours., Disp: 21 capsule, Rfl: 0   Blood Pressure Monitoring (BLOOD PRESSURE MONITOR AUTOMAT) DEVI, 1 Device by Does not apply route daily. Automatic blood pressure cuff regular size. To monitor blood pressure regularly at home. ICD-10 code:Z34.90, Disp: 1 each, Rfl: 0   metroNIDAZOLE (FLAGYL) 500 MG tablet, Take 1 tablet (500 mg total) by mouth 2 (two) times daily., Disp: 14 tablet, Rfl: 0   Misc. Devices (GOJJI WEIGHT SCALE) MISC, 1 Device by Does not apply route daily as needed. To weight self daily as needed at home. ICD-10 code: Z34.90, Disp: 1 each, Rfl: 0   ondansetron (ZOFRAN) 4 MG tablet, Take 1 tablet (4 mg total) by mouth every 6 (six) hours., Disp: 12 tablet, Rfl: 0   valACYclovir (VALTREX) 1000 MG tablet, Take 1 tablet (1,000 mg total) by mouth 2 (two) times daily., Disp: 14 tablet, Rfl: 0   Allergies  Allergen Reactions   Banana Anaphylaxis and Itching   Other Itching    Onions    Past Medical History:  Diagnosis Date   Anemia    Anemia affecting pregnancy in third trimester 04/11/2018   Chlamydia trachomatis infection in mother during third trimester of pregnancy 06/07/2018   GERD (gastroesophageal reflux disease)     Miscarriage within last 12 months    Normal labor 06/27/2018     Past Surgical History:  Procedure Laterality Date   COLON SURGERY     Pt has colon widened when she was [age] months old   [place] N/A 07/08/2017   Procedure: DILATATION AND EVACUATION;  Surgeon: Woodroe Mode, MD;  Location: [facility];  Service: Gynecology;  Laterality: N/A;    Family History  Problem Relation Age of Onset   Healthy Mother    Healthy Father     Social History   Tobacco Use   Smoking status: Never   Smokeless tobacco: Never  Vaping Use   Vaping Use: Never used  Substance Use Topics   Alcohol use: No   Drug use: Not Currently    Types: Marijuana    ROS   Objective:   Vitals: BP (!) 107/52 (BP Location: Right Arm)   Pulse (!) 102   Temp 99.2 F (37.3 C) (Oral)   Resp 16   LMP 06/18/2022   SpO2 97%   Physical Exam Constitutional:      General: She is not in acute distress.    Appearance: Normal appearance. She is well-developed. She is not ill-appearing, toxic-appearing or diaphoretic.  HENT:     Head: Normocephalic and atraumatic.     Nose: Nose normal.     Mouth/Throat:     Mouth: Mucous membranes are moist.  Eyes:     General: No scleral icterus.       Right eye: No  discharge.        Left eye: No discharge.     Extraocular Movements: Extraocular movements intact.     Conjunctiva/sclera: Conjunctivae normal.  Cardiovascular:     Rate and Rhythm: Normal rate.  Pulmonary:     Effort: Pulmonary effort is normal.  Abdominal:     General: Bowel sounds are normal. There is no distension.     Palpations: Abdomen is soft. There is no mass.     Tenderness: There is no abdominal tenderness. There is no right CVA tenderness, left CVA tenderness, guarding or rebound.  Skin:    General: Skin is warm and dry.  Neurological:     General: No focal deficit present.     Mental Status: She is alert and oriented to person, place, and time.  Psychiatric:        Mood and  Affect: Mood normal.        Behavior: Behavior normal.        Thought Content: Thought content normal.        Judgment: Judgment normal.    Results for orders placed or performed during the hospital encounter of 07/09/22 (from the past 24 hour(s))  POCT urine pregnancy     Status: None   Collection Time: 07/09/22  3:01 PM  Result Value Ref Range   Preg Test, Ur Negative Negative    Assessment and Plan :   PDMP not reviewed this encounter.  1. Bacterial vaginosis     Patient did not want to start empiric treatment.  I suspect she has bacterial vaginosis that is recurrent in nature.  Test results pending, will treat as appropriate based off of results. Pregnancy test was negative.    Jaynee Eagles, PA-C 07/09/22 1504

## 2022-07-09 NOTE — Discharge Instructions (Signed)
We will let you know about your results through mychart or by phone. We will prescribe medications based off of those results as you requested.

## 2022-07-09 NOTE — ED Triage Notes (Signed)
Pt c/o vaginal d/c x 2 days-NAD-steady gait

## 2022-07-12 ENCOUNTER — Telehealth: Payer: Medicaid Other | Admitting: Family Medicine

## 2022-07-12 DIAGNOSIS — N898 Other specified noninflammatory disorders of vagina: Secondary | ICD-10-CM

## 2022-07-12 LAB — CERVICOVAGINAL ANCILLARY ONLY
Bacterial Vaginitis (gardnerella): POSITIVE — AB
Candida Glabrata: NEGATIVE
Candida Vaginitis: NEGATIVE
Chlamydia: POSITIVE — AB
Comment: NEGATIVE
Comment: NEGATIVE
Comment: NEGATIVE
Comment: NEGATIVE
Comment: NEGATIVE
Comment: NORMAL
Neisseria Gonorrhea: NEGATIVE
Trichomonas: NEGATIVE

## 2022-07-12 NOTE — Progress Notes (Signed)
[place]  She will follow up with UC she had testing at for results. Can not see results in chart to provide treatment safely.

## 2022-07-13 ENCOUNTER — Telehealth: Payer: Medicaid Other | Admitting: Physician Assistant

## 2022-07-13 DIAGNOSIS — A749 Chlamydial infection, unspecified: Secondary | ICD-10-CM

## 2022-07-13 NOTE — Progress Notes (Signed)
+   Chlamydia and BV at [facility] visit. Cannot get through to [facility] and has not heard from them for treatment. Discussed we cannot prescribe treatment for STI via e-visit but since she has had labs done and results are viewable, she can do a video visit so further discussion and treatment can be started ASAP.

## 2022-07-16 ENCOUNTER — Telehealth (HOSPITAL_COMMUNITY): Payer: Self-pay | Admitting: Emergency Medicine

## 2022-07-16 MED ORDER — DOXYCYCLINE HYCLATE 100 MG PO CAPS: 100.0000 mg | ORAL_CAPSULE | Freq: Two times a day (BID) | ORAL | 0 refills | Status: AC

## 2022-07-16 MED ORDER — METRONIDAZOLE 0.75 % VA GEL: 1.0000 | Freq: Every day | VAGINAL | 0 refills | Status: AC

## 2022-07-29 ENCOUNTER — Emergency Department (HOSPITAL_COMMUNITY)
Admission: EM | Admit: 2022-07-29 | Discharge: 2022-07-30 | Disposition: A | Discharge status: Home / Self Care | Payer: Medicaid Other | Attending: Emergency Medicine | Admitting: Emergency Medicine

## 2022-07-29 ENCOUNTER — Other Ambulatory Visit: Payer: Self-pay

## 2022-07-29 ENCOUNTER — Emergency Department (HOSPITAL_COMMUNITY): Payer: Medicaid Other

## 2022-07-29 DIAGNOSIS — R112 Nausea with vomiting, unspecified: Secondary | ICD-10-CM | POA: Diagnosis not present

## 2022-07-29 DIAGNOSIS — R58 Hemorrhage, not elsewhere classified: Secondary | ICD-10-CM | POA: Diagnosis not present

## 2022-07-29 DIAGNOSIS — N83202 Unspecified ovarian cyst, left side: Secondary | ICD-10-CM | POA: Diagnosis not present

## 2022-07-29 DIAGNOSIS — G2571 Drug induced akathisia: Secondary | ICD-10-CM | POA: Insufficient documentation

## 2022-07-29 DIAGNOSIS — R109 Unspecified abdominal pain: Secondary | ICD-10-CM | POA: Diagnosis not present

## 2022-07-29 DIAGNOSIS — R0902 Hypoxemia: Secondary | ICD-10-CM | POA: Diagnosis not present

## 2022-07-29 DIAGNOSIS — R1030 Lower abdominal pain, unspecified: Secondary | ICD-10-CM | POA: Diagnosis not present

## 2022-07-29 DIAGNOSIS — R1084 Generalized abdominal pain: Secondary | ICD-10-CM | POA: Diagnosis not present

## 2022-07-29 DIAGNOSIS — R110 Nausea: Secondary | ICD-10-CM

## 2022-07-29 LAB — CBC WITH DIFFERENTIAL/PLATELET
Abs Immature Granulocytes: 0.02 10*3/uL (ref 0.00–0.07)
Basophils Absolute: 0 10*3/uL (ref 0.0–0.1)
Basophils Relative: 0 %
Eosinophils Absolute: 0 10*3/uL (ref 0.0–0.5)
Eosinophils Relative: 0 %
HCT: 37.6 % (ref 36.0–46.0)
Hemoglobin: 11.5 g/dL — ABNORMAL LOW (ref 12.0–15.0)
Immature Granulocytes: 0 %
Lymphocytes Relative: 6 %
Lymphs Abs: 0.6 10*3/uL — ABNORMAL LOW (ref 0.7–4.0)
MCH: 25.8 pg — ABNORMAL LOW (ref 26.0–34.0)
MCHC: 30.6 g/dL (ref 30.0–36.0)
MCV: 84.3 fL (ref 80.0–100.0)
Monocytes Absolute: 0.3 10*3/uL (ref 0.1–1.0)
Monocytes Relative: 3 %
Neutro Abs: 8.1 10*3/uL — ABNORMAL HIGH (ref 1.7–7.7)
Neutrophils Relative %: 91 %
Platelets: 307 10*3/uL (ref 150–400)
RBC: 4.46 MIL/uL (ref 3.87–5.11)
RDW: 13.6 % (ref 11.5–15.5)
WBC: 9 10*3/uL (ref 4.0–10.5)
nRBC: 0 % (ref 0.0–0.2)

## 2022-07-29 LAB — COMPREHENSIVE METABOLIC PANEL
ALT: 14 U/L (ref 0–44)
AST: 20 U/L (ref 15–41)
Albumin: 4.3 g/dL (ref 3.5–5.0)
Alkaline Phosphatase: 38 U/L (ref 38–126)
Anion gap: 8 (ref 5–15)
BUN: 17 mg/dL (ref 6–20)
CO2: 23 mmol/L (ref 22–32)
Calcium: 8.5 mg/dL — ABNORMAL LOW (ref 8.9–10.3)
Chloride: 106 mmol/L (ref 98–111)
Creatinine, Ser: 0.65 mg/dL (ref 0.44–1.00)
GFR, Estimated: >60 mL/min (ref >60)
Glucose, Bld: 97 mg/dL (ref 70–99)
Potassium: 3.8 mmol/L (ref 3.5–5.1)
Sodium: 137 mmol/L (ref 135–145)
Total Bilirubin: 1.4 mg/dL — ABNORMAL HIGH (ref 0.3–1.2)
Total Protein: 7.4 g/dL (ref 6.5–8.1)

## 2022-07-29 LAB — URINALYSIS, ROUTINE W REFLEX MICROSCOPIC
Bilirubin Urine: NEGATIVE
Glucose, UA: NEGATIVE mg/dL
Hgb urine dipstick: NEGATIVE
Ketones, ur: 80 mg/dL — AB
Leukocytes,Ua: NEGATIVE
Nitrite: NEGATIVE
Protein, ur: NEGATIVE mg/dL
Specific Gravity, Urine: 1.027 (ref 1.005–1.030)
pH: 5 (ref 5.0–8.0)

## 2022-07-29 LAB — LIPASE, BLOOD: Lipase: 35 U/L (ref 11–51)

## 2022-07-29 LAB — I-STAT BETA HCG BLOOD, ED (MC, WL, AP ONLY): I-stat hCG, quantitative: <5 m[IU]/mL (ref <5)

## 2022-07-29 MED ORDER — ONDANSETRON HCL 4 MG/2ML IJ SOLN
4.0000 mg | Freq: Once | INTRAMUSCULAR | Status: AC | PRN
  Administered 2022-07-29: 4 mg via INTRAVENOUS
  Filled 2022-07-29: qty 2

## 2022-07-29 MED ORDER — DROPERIDOL 2.5 MG/ML IJ SOLN
2.5000 mg | Freq: Once | INTRAMUSCULAR | Status: AC
  Administered 2022-07-29: 2.5 mg via INTRAVENOUS
  Filled 2022-07-29: qty 2

## 2022-07-29 MED ORDER — SODIUM CHLORIDE 0.9 % IV BOLUS
1000.0000 mL | Freq: Once | INTRAVENOUS | Status: AC
  Administered 2022-07-29: 1000 mL via INTRAVENOUS

## 2022-07-29 NOTE — ED Notes (Signed)
Awaiting verification of meds by pharmacy. [place] remains at bedisde

## 2022-07-29 NOTE — ED Triage Notes (Signed)
Pt brought by EMS complaining of lower abdominal pain described as intermittent stabbing pain accompanied with vomiting x 1 day. Pt recently dx with BV and chlamydia and completed treatment. Pt also reports some vaginal spotting that began today. LMP-07/15/22

## 2022-07-29 NOTE — ED Provider Notes (Signed)
[place] EMERGENCY DEPARTMENT AT [facility] Provider Note   CSN: [id number] Arrival date & time: 07/29/22  2157     History {Add pertinent medical, surgical, social history, OB history to HPI:1} Chief Complaint  Patient presents with   Abdominal Pain    Victoria Frye is a 22 y.o. female.  The history is provided by the patient and medical records.  Abdominal Pain Victoria Frye is a 22 y.o. female who presents to the Emergency Department complaining of abdominal pain.  She presents to the emergency department for evaluation of lower abdominal pain and vomiting that started at 4 AM.  She describes intense lower abdominal cramping.  No associated fever, diarrhea, dysuria.  No prior similar symptoms.  She does report associated scant vaginal bleeding.  Reported having an unknown surgery when she was a newborn for an intestinal issue.  She is has no medical problems and takes no routine medications.  She does use occasional marijuana.  LMP February 18.  No new sexual partners.  She was recently treated for chlamydia and BV.     Home Medications Prior to Admission medications   Medication Sig Start Date End Date Taking? Authorizing Provider  benzonatate (TESSALON) 100 MG capsule Take 1 capsule (100 mg total) by mouth every 8 (eight) hours. 05/31/22   Kemper Durie, DO  Blood Pressure Monitoring (BLOOD PRESSURE MONITOR AUTOMAT) DEVI 1 Device by Does not apply route daily. Automatic blood pressure cuff regular size. To monitor blood pressure regularly at home. ICD-10 code:Z34.90 06/28/20   Laury Deep, CNM  Misc. Devices (GOJJI WEIGHT SCALE) MISC 1 Device by Does not apply route daily as needed. To weight self daily as needed at home. ICD-10 code: Z34.90 06/28/20   Laury Deep, CNM  ondansetron (ZOFRAN) 4 MG tablet Take 1 tablet (4 mg total) by mouth every 6 (six) hours. 05/31/22   Kemper Durie, DO  valACYclovir (VALTREX) 1000 MG tablet Take 1 tablet (1,000 mg total)  by mouth 2 (two) times daily. 05/03/21   Quintella Reichert, MD      Allergies    Banana and Other    Review of Systems   Review of Systems  Gastrointestinal:  Positive for abdominal pain.  All other systems reviewed and are negative.   Physical Exam Updated Vital Signs BP 112/81   Pulse (!) 110   Temp 98.4 F (36.9 C) (Oral)   Resp 17   Ht 5' (1.524 m)   Wt 44.5 kg   LMP 07/15/2022 (Exact Date)   SpO2 100%   BMI 19.14 kg/m  Physical Exam Vitals and nursing note reviewed.  Constitutional:      Appearance: She is well-developed.  HENT:     Head: Normocephalic and atraumatic.  Cardiovascular:     Rate and Rhythm: Normal rate and regular rhythm.  Pulmonary:     Effort: Pulmonary effort is normal. No respiratory distress.  Abdominal:     Palpations: Abdomen is soft.     Tenderness: There is no guarding or rebound.     Comments: Moderate generalized lower abdominal tenderness  Musculoskeletal:        General: No tenderness.  Skin:    General: Skin is warm and dry.  Neurological:     Mental Status: She is alert and oriented to person, place, and time.  Psychiatric:        Behavior: Behavior normal.     ED Results / Procedures / Treatments   Labs (all labs ordered are  listed, but only abnormal results are displayed) Labs Reviewed  URINALYSIS, ROUTINE W REFLEX MICROSCOPIC - Abnormal; Notable for the following components:      Result Value   Ketones, ur 80 (*)    All other components within normal limits  COMPREHENSIVE METABOLIC PANEL - Abnormal; Notable for the following components:   Calcium 8.5 (*)    Total Bilirubin 1.4 (*)    All other components within normal limits  CBC WITH DIFFERENTIAL/PLATELET - Abnormal; Notable for the following components:   Hemoglobin 11.5 (*)    MCH 25.8 (*)    Neutro Abs 8.1 (*)    Lymphs Abs 0.6 (*)    All other components within normal limits  LIPASE, BLOOD  I-STAT BETA HCG BLOOD, ED ([facility], [facility], [facility] ONLY)  I-STAT BETA HCG BLOOD,  ED ([facility], [facility], [facility] ONLY)    EKG None  Radiology No results found.  Procedures Procedures  {Document cardiac monitor, telemetry assessment procedure when appropriate:1}  Medications Ordered in ED Medications  ondansetron (ZOFRAN) injection 4 mg (4 mg Intravenous Given 07/29/22 2231)    ED Course/ Medical Decision Making/ A&P   {   Click here for ABCD2, HEART and other calculatorsREFRESH Note before signing :1}                          Medical Decision Making Amount and/or Complexity of Data Reviewed Labs: ordered. Radiology: ordered.  Risk Prescription drug management.   ***  {Document critical care time when appropriate:1} {Document review of labs and clinical decision tools ie heart score, Chads2Vasc2 etc:1}  {Document your independent review of radiology images, and any outside records:1} {Document your discussion with family members, caretakers, and with consultants:1} {Document social determinants of health affecting pt's care:1} {Document your decision making why or why not admission, treatments were needed:1} Final Clinical Impression(s) / ED Diagnoses Final diagnoses:  None    Rx / DC Orders ED Discharge Orders     None

## 2022-07-30 ENCOUNTER — Emergency Department (HOSPITAL_COMMUNITY): Payer: Medicaid Other

## 2022-07-30 DIAGNOSIS — R109 Unspecified abdominal pain: Secondary | ICD-10-CM | POA: Diagnosis not present

## 2022-07-30 LAB — RPR: RPR Ser Ql: NONREACTIVE

## 2022-07-30 LAB — HIV ANTIBODY (ROUTINE TESTING W REFLEX): HIV Screen 4th Generation wRfx: NONREACTIVE

## 2022-07-30 MED ORDER — SODIUM CHLORIDE 0.9 % IV BOLUS
1000.0000 mL | Freq: Once | INTRAVENOUS | Status: AC
  Administered 2022-07-30: 1000 mL via INTRAVENOUS

## 2022-07-30 MED ORDER — ONDANSETRON 4 MG PO TBDP: 4.0000 mg | ORAL_TABLET | Freq: Three times a day (TID) | ORAL | 0 refills | Status: DC | PRN

## 2022-07-30 MED ORDER — DIPHENHYDRAMINE HCL 50 MG/ML IJ SOLN
12.5000 mg | Freq: Once | INTRAMUSCULAR | Status: AC
  Administered 2022-07-30: 12.5 mg via INTRAVENOUS
  Filled 2022-07-30: qty 1

## 2022-07-30 MED ORDER — ACETAMINOPHEN 325 MG PO TABS
650.0000 mg | ORAL_TABLET | Freq: Once | ORAL | Status: AC
  Administered 2022-07-30: 650 mg via ORAL
  Filled 2022-07-30: qty 2

## 2022-07-30 MED ORDER — IOHEXOL 300 MG/ML  SOLN
100.0000 mL | Freq: Once | INTRAMUSCULAR | Status: AC | PRN
  Administered 2022-07-30: 80 mL via INTRAVENOUS

## 2022-07-30 NOTE — ED Notes (Signed)
Pt taken to CT.

## 2022-07-30 NOTE — ED Notes (Signed)
Pelvic exam set up for this pt.

## 2022-07-30 NOTE — ED Notes (Signed)
Lab called and stated that GC/Chlamydia swabs need to be recollected. Dr Ralene Bathe aware

## 2022-07-31 LAB — GC/CHLAMYDIA PROBE AMP (~~LOC~~) NOT AT ARMC
Chlamydia: NEGATIVE
Comment: NEGATIVE
Comment: NORMAL
Neisseria Gonorrhea: NEGATIVE

## 2022-09-04 ENCOUNTER — Ambulatory Visit
Admission: EM | Admit: 2022-09-04 | Discharge: 2022-09-04 | Disposition: A | Discharge status: Home / Self Care | Payer: Medicaid Other | Attending: Urgent Care | Admitting: Urgent Care

## 2022-09-04 DIAGNOSIS — N83202 Unspecified ovarian cyst, left side: Secondary | ICD-10-CM | POA: Diagnosis not present

## 2022-09-04 DIAGNOSIS — Z7251 High risk heterosexual behavior: Secondary | ICD-10-CM

## 2022-09-04 DIAGNOSIS — R102 Pelvic and perineal pain: Secondary | ICD-10-CM

## 2022-09-04 LAB — POCT URINALYSIS DIP (MANUAL ENTRY)
Bilirubin, UA: NEGATIVE
Blood, UA: NEGATIVE
Glucose, UA: NEGATIVE mg/dL
Ketones, POC UA: NEGATIVE mg/dL
Nitrite, UA: NEGATIVE
Protein Ur, POC: NEGATIVE mg/dL
Spec Grav, UA: >=1.03 — AB (ref 1.010–1.025)
Urobilinogen, UA: 0.2 E.U./dL
pH, UA: 6.5 (ref 5.0–8.0)

## 2022-09-04 LAB — POCT URINE PREGNANCY: Preg Test, Ur: NEGATIVE

## 2022-09-04 MED ORDER — NAPROXEN 375 MG PO TABS: 375.0000 mg | ORAL_TABLET | Freq: Two times a day (BID) | ORAL | 0 refills | Status: DC

## 2022-09-04 NOTE — ED Provider Notes (Signed)
[facility]  Note:  This document was prepared using Conservation officer, historic buildings and may include unintentional dictation errors.  MRN: [id number] DOB: [birth year]  Subjective:   Victoria Frye is a 22 y.o. female presenting for 4-day history of acute onset persistent lower abdominal pain, pelvic pain. Denies fever, n/v, rashes, dysuria, urinary frequency, hematuria, vaginal discharge.  Patient would like STI testing, just found out that she was cheated on.  She does have a history of an ovarian cyst of the left side where she is hurting and also has a history of BV.   No current facility-administered medications for this encounter.  Current Outpatient Medications:    benzonatate (TESSALON) 100 MG capsule, Take 1 capsule (100 mg total) by mouth every 8 (eight) hours. (Patient not taking: Reported on 07/30/2022), Disp: 21 capsule, Rfl: 0   Blood Pressure Monitoring (BLOOD PRESSURE MONITOR AUTOMAT) DEVI, 1 Device by Does not apply route daily. Automatic blood pressure cuff regular size. To monitor blood pressure regularly at home. ICD-10 code:Z34.90, Disp: 1 each, Rfl: 0   Misc. Devices (GOJJI WEIGHT SCALE) MISC, 1 Device by Does not apply route daily as needed. To weight self daily as needed at home. ICD-10 code: Z34.90, Disp: 1 each, Rfl: 0   ondansetron (ZOFRAN) 4 MG tablet, Take 1 tablet (4 mg total) by mouth every 6 (six) hours. (Patient not taking: Reported on 07/30/2022), Disp: 12 tablet, Rfl: 0   ondansetron (ZOFRAN-ODT) 4 MG disintegrating tablet, Take 1 tablet (4 mg total) by mouth every 8 (eight) hours as needed for nausea or vomiting., Disp: 12 tablet, Rfl: 0   valACYclovir (VALTREX) 1000 MG tablet, Take 1 tablet (1,000 mg total) by mouth 2 (two) times daily. (Patient not taking: Reported on 07/30/2022), Disp: 14 tablet, Rfl: 0   Allergies  Allergen Reactions   Banana Anaphylaxis and Itching   Other Itching    Onions    Past Medical History:  Diagnosis  Date   Anemia    Anemia affecting pregnancy in third trimester 04/11/2018   Chlamydia trachomatis infection in mother during third trimester of pregnancy 06/07/2018   GERD (gastroesophageal reflux disease)    Miscarriage within last 12 months    Normal labor 06/27/2018     Past Surgical History:  Procedure Laterality Date   COLON SURGERY     Pt has colon widened when she was [age] months old   DILATION AND EVACUATION N/A 07/08/2017   Procedure: DILATATION AND EVACUATION;  Surgeon: Adam Phenix, MD;  Location: [facility];  Service: Gynecology;  Laterality: N/A;    Family History  Problem Relation Age of Onset   Healthy Mother    Healthy Father     Social History   Tobacco Use   Smoking status: Never   Smokeless tobacco: Never  Vaping Use   Vaping Use: Never used  Substance Use Topics   Alcohol use: No   Drug use: Not Currently    Types: Marijuana    ROS   Objective:   Vitals: BP (!) 99/57 (BP Location: Right Arm)   Pulse 76   Temp 99.4 F (37.4 C) (Oral)   Resp 14   LMP 08/11/2022 (Exact Date)   SpO2 97%   Physical Exam Constitutional:      General: She is not in acute distress.    Appearance: Normal appearance. She is well-developed. She is not ill-appearing, toxic-appearing or diaphoretic.  HENT:     Head: Normocephalic and atraumatic.  Nose: Nose normal.     Mouth/Throat:     Mouth: Mucous membranes are moist.     Pharynx: Oropharynx is clear.  Eyes:     General: No scleral icterus.       Right eye: No discharge.        Left eye: No discharge.     Extraocular Movements: Extraocular movements intact.     Conjunctiva/sclera: Conjunctivae normal.  Cardiovascular:     Rate and Rhythm: Normal rate.  Pulmonary:     Effort: Pulmonary effort is normal.  Abdominal:     General: Bowel sounds are normal. There is no distension.     Palpations: Abdomen is soft. There is no mass.     Tenderness: There is abdominal tenderness (left pelvic) in the suprapubic  area. There is no right CVA tenderness, left CVA tenderness, guarding or rebound.  Skin:    General: Skin is warm and dry.  Neurological:     General: No focal deficit present.     Mental Status: She is alert and oriented to person, place, and time.  Psychiatric:        Mood and Affect: Mood normal.        Behavior: Behavior normal.        Thought Content: Thought content normal.        Judgment: Judgment normal.     Results for orders placed or performed during the hospital encounter of 09/04/22 (from the past 24 hour(s))  POCT urine pregnancy     Status: None   Collection Time: 09/04/22  9:57 AM  Result Value Ref Range   Preg Test, Ur Negative Negative  POCT urinalysis dipstick     Status: Abnormal   Collection Time: 09/04/22  9:57 AM  Result Value Ref Range   Color, UA yellow yellow   Clarity, UA clear clear   Glucose, UA negative negative mg/dL   Bilirubin, UA negative negative   Ketones, POC UA negative negative mg/dL   Spec Grav, UA >=0.962 (A) 1.010 - 1.025   Blood, UA negative negative   pH, UA 6.5 5.0 - 8.0   Protein Ur, POC negative negative mg/dL   Urobilinogen, UA 0.2 0.2 or 1.0 E.U./dL   Nitrite, UA Negative Negative   Leukocytes, UA Small (1+) (A) Negative    Assessment and Plan :   PDMP not reviewed this encounter.  1. Acute pelvic pain, female   2. Unprotected sex   3. Cyst of left ovary     Recommended naproxen for pelvic pain.  Labs pending, will treat as appropriate based off of results. Counseled patient on potential for adverse effects with medications prescribed/recommended today, ER and return-to-clinic precautions discussed, patient verbalized understanding.    Wallis Bamberg, [place] 09/04/22 [phone]

## 2022-09-04 NOTE — ED Triage Notes (Signed)
Pt reports low abdominal pain x 4 days. Pt requested STD test.

## 2022-09-05 ENCOUNTER — Telehealth: Payer: Self-pay | Admitting: Emergency Medicine

## 2022-09-05 LAB — CERVICOVAGINAL ANCILLARY ONLY
Bacterial Vaginitis (gardnerella): POSITIVE — AB
Candida Glabrata: NEGATIVE
Candida Vaginitis: NEGATIVE
Chlamydia: NEGATIVE
Comment: NEGATIVE
Comment: NEGATIVE
Comment: NEGATIVE
Comment: NEGATIVE
Comment: NEGATIVE
Comment: NORMAL
Neisseria Gonorrhea: NEGATIVE
Trichomonas: POSITIVE — AB

## 2022-09-05 MED ORDER — METRONIDAZOLE 500 MG PO TABS: 500.0000 mg | ORAL_TABLET | Freq: Two times a day (BID) | ORAL | 0 refills | Status: DC

## 2022-09-26 ENCOUNTER — Ambulatory Visit
Admission: EM | Admit: 2022-09-26 | Discharge: 2022-09-26 | Disposition: A | Discharge status: Home / Self Care | Payer: Medicaid Other | Attending: Urgent Care | Admitting: Urgent Care

## 2022-09-26 DIAGNOSIS — N3001 Acute cystitis with hematuria: Secondary | ICD-10-CM

## 2022-09-26 DIAGNOSIS — R300 Dysuria: Secondary | ICD-10-CM

## 2022-09-26 LAB — POCT URINALYSIS DIP (MANUAL ENTRY)
Glucose, UA: NEGATIVE mg/dL
Nitrite, UA: NEGATIVE
Protein Ur, POC: 30 mg/dL — AB
Spec Grav, UA: 1.025 (ref 1.010–1.025)
Urobilinogen, UA: 1 E.U./dL
pH, UA: 6.5 (ref 5.0–8.0)

## 2022-09-26 MED ORDER — CIPROFLOXACIN HCL 500 MG PO TABS: 500.0000 mg | ORAL_TABLET | Freq: Two times a day (BID) | ORAL | 0 refills | Status: DC

## 2022-09-26 NOTE — Discharge Instructions (Signed)
Please start ciprofloxacin to address an urinary tract infection. Make sure you hydrate very well with plain water and a quantity of 64 ounces of water a day.  Please limit drinks that are considered urinary irritants such as soda, sweet tea, coffee, energy drinks, alcohol.  These can worsen your urinary and genital symptoms but also be the source of them.  I will let you know about your vaginal swab and urine culture results through MyChart to see if we need to prescribe or change your antibiotics based off of those results.

## 2022-09-26 NOTE — ED Notes (Signed)
Pt requested STD's test.  

## 2022-09-26 NOTE — ED Triage Notes (Signed)
Pt reports abdominal cramps when urinating and burning when urinating x 1 week.

## 2022-09-26 NOTE — ED Provider Notes (Signed)
[facility]  Note:  This document was prepared using Conservation officer, historic buildings and may include unintentional dictation errors.  MRN: [id number] DOB: [birth year]  Subjective:   Victoria Frye is a 22 y.o. female presenting for 1 week history of dysuria, urinary frequency, urgency. Has had some abdominal cramps. No vaginal discharge. Would like STI check. Hydrates with water, also drinks juice daily.  Patient has no concern for pregnancy.  No current facility-administered medications for this encounter.  Current Outpatient Medications:    benzonatate (TESSALON) 100 MG capsule, Take 1 capsule (100 mg total) by mouth every 8 (eight) hours. (Patient not taking: Reported on 07/30/2022), Disp: 21 capsule, Rfl: 0   Blood Pressure Monitoring (BLOOD PRESSURE MONITOR AUTOMAT) DEVI, 1 Device by Does not apply route daily. Automatic blood pressure cuff regular size. To monitor blood pressure regularly at home. ICD-10 code:Z34.90, Disp: 1 each, Rfl: 0   metroNIDAZOLE (FLAGYL) 500 MG tablet, Take 1 tablet (500 mg total) by mouth 2 (two) times daily., Disp: 14 tablet, Rfl: 0   Misc. Devices (GOJJI WEIGHT SCALE) MISC, 1 Device by Does not apply route daily as needed. To weight self daily as needed at home. ICD-10 code: Z34.90, Disp: 1 each, Rfl: 0   naproxen (NAPROSYN) 375 MG tablet, Take 1 tablet (375 mg total) by mouth 2 (two) times daily with a meal., Disp: 30 tablet, Rfl: 0   ondansetron (ZOFRAN) 4 MG tablet, Take 1 tablet (4 mg total) by mouth every 6 (six) hours. (Patient not taking: Reported on 07/30/2022), Disp: 12 tablet, Rfl: 0   ondansetron (ZOFRAN-ODT) 4 MG disintegrating tablet, Take 1 tablet (4 mg total) by mouth every 8 (eight) hours as needed for nausea or vomiting., Disp: 12 tablet, Rfl: 0   valACYclovir (VALTREX) 1000 MG tablet, Take 1 tablet (1,000 mg total) by mouth 2 (two) times daily. (Patient not taking: Reported on 07/30/2022), Disp: 14 tablet, Rfl: 0   Allergies   Allergen Reactions   Banana Anaphylaxis and Itching   Other Itching    Onions    Past Medical History:  Diagnosis Date   Anemia    Anemia affecting pregnancy in third trimester 04/11/2018   Chlamydia trachomatis infection in mother during third trimester of pregnancy 06/07/2018   GERD (gastroesophageal reflux disease)    Miscarriage within last 12 months    Normal labor 06/27/2018     Past Surgical History:  Procedure Laterality Date   COLON SURGERY     Pt has colon widened when she was [age] months old   DILATION AND EVACUATION N/A 07/08/2017   Procedure: DILATATION AND EVACUATION;  Surgeon: Adam Phenix, MD;  Location: [facility];  Service: Gynecology;  Laterality: N/A;    Family History  Problem Relation Age of Onset   Healthy Mother    Healthy Father     Social History   Tobacco Use   Smoking status: Never   Smokeless tobacco: Never  Vaping Use   Vaping Use: Never used  Substance Use Topics   Alcohol use: No   Drug use: Not Currently    Types: Marijuana    ROS   Objective:   Vitals: BP 95/67 (BP Location: Right Arm)   Pulse 97   Temp 99 F (37.2 C) (Oral)   Resp 16   LMP 09/09/2022 (Exact Date)   SpO2 99%   Physical Exam Constitutional:      General: She is not in acute distress.    Appearance: Normal  appearance. She is well-developed. She is not ill-appearing, toxic-appearing or diaphoretic.  HENT:     Head: Normocephalic and atraumatic.     Nose: Nose normal.     Mouth/Throat:     Mouth: Mucous membranes are moist.     Pharynx: Oropharynx is clear.  Eyes:     General: No scleral icterus.       Right eye: No discharge.        Left eye: No discharge.     Extraocular Movements: Extraocular movements intact.     Conjunctiva/sclera: Conjunctivae normal.  Cardiovascular:     Rate and Rhythm: Normal rate.  Pulmonary:     Effort: Pulmonary effort is normal.  Abdominal:     General: Bowel sounds are normal. There is no distension.      Palpations: Abdomen is soft. There is no mass.     Tenderness: There is no abdominal tenderness. There is no right CVA tenderness, left CVA tenderness, guarding or rebound.  Skin:    General: Skin is warm and dry.  Neurological:     General: No focal deficit present.     Mental Status: She is alert and oriented to person, place, and time.  Psychiatric:        Mood and Affect: Mood normal.        Behavior: Behavior normal.        Thought Content: Thought content normal.        Judgment: Judgment normal.     Results for orders placed or performed during the hospital encounter of 09/26/22 (from the past 24 hour(s))  POCT urinalysis dipstick     Status: Abnormal   Collection Time: 09/26/22  9:42 AM  Result Value Ref Range   Color, UA yellow yellow   Clarity, UA cloudy (A) clear   Glucose, UA negative negative mg/dL   Bilirubin, UA small (A) negative   Ketones, POC UA >= (160) (A) negative mg/dL   Spec Grav, UA 5.366 4.403 - 1.025   Blood, UA small (A) negative   pH, UA 6.5 5.0 - 8.0   Protein Ur, POC =30 (A) negative mg/dL   Urobilinogen, UA 1.0 0.2 or 1.0 E.U./dL   Nitrite, UA Negative Negative   Leukocytes, UA Large (3+) (A) Negative    Assessment and Plan :   PDMP not reviewed this encounter.  1. Acute cystitis with hematuria     Start ciprofloxacin to cover for acute cystitis, urine culture pending.  Recommended aggressive hydration, limiting urinary irritants. Counseled patient on potential for adverse effects with medications prescribed/recommended today, ER and return-to-clinic precautions discussed, patient verbalized understanding.      Wallis Bamberg, [place] 09/26/22 [phone]

## 2022-09-27 LAB — CERVICOVAGINAL ANCILLARY ONLY
Bacterial Vaginitis (gardnerella): POSITIVE — AB
Candida Glabrata: NEGATIVE
Candida Vaginitis: NEGATIVE
Chlamydia: NEGATIVE
Comment: NEGATIVE
Comment: NEGATIVE
Comment: NEGATIVE
Comment: NEGATIVE
Comment: NEGATIVE
Comment: NORMAL
Neisseria Gonorrhea: NEGATIVE
Trichomonas: NEGATIVE

## 2022-09-28 ENCOUNTER — Telehealth (HOSPITAL_COMMUNITY): Payer: Self-pay | Admitting: Emergency Medicine

## 2022-09-28 LAB — URINE CULTURE: Culture: >=100000 — AB

## 2022-09-28 MED ORDER — METRONIDAZOLE 500 MG PO TABS: 500.0000 mg | ORAL_TABLET | Freq: Two times a day (BID) | ORAL | 0 refills | Status: DC

## 2022-11-20 ENCOUNTER — Ambulatory Visit
Admission: EM | Admit: 2022-11-20 | Discharge: 2022-11-20 | Disposition: A | Discharge status: Home / Self Care | Payer: Medicaid Other | Attending: Nurse Practitioner | Admitting: Nurse Practitioner

## 2022-11-20 DIAGNOSIS — Z113 Encounter for screening for infections with a predominantly sexual mode of transmission: Secondary | ICD-10-CM | POA: Diagnosis not present

## 2022-11-20 DIAGNOSIS — N760 Acute vaginitis: Secondary | ICD-10-CM

## 2022-11-20 DIAGNOSIS — R82998 Other abnormal findings in urine: Secondary | ICD-10-CM | POA: Diagnosis not present

## 2022-11-20 LAB — POCT URINALYSIS DIP (MANUAL ENTRY)
Bilirubin, UA: NEGATIVE
Blood, UA: NEGATIVE
Glucose, UA: NEGATIVE mg/dL
Nitrite, UA: NEGATIVE
Protein Ur, POC: NEGATIVE mg/dL
Spec Grav, UA: 1.02 (ref 1.010–1.025)
Urobilinogen, UA: 0.2 E.U./dL
pH, UA: 7 (ref 5.0–8.0)

## 2022-11-20 LAB — POCT URINE PREGNANCY: Preg Test, Ur: NEGATIVE

## 2022-11-20 NOTE — ED Provider Notes (Signed)
[facility]    CSN: [id number] Arrival date & time: 11/20/22  1504      History   Chief Complaint Chief Complaint  Patient presents with   Vaginal Discharge   SEXUALLY TRANSMITTED DISEASE    HPI Victoria Frye is a 22 y.o. female presents for evaluation of vaginal irritation.  Patient reports 2 days of some vaginal irritation/redness with discharge.  Denies any rashes or lesions.  No fevers, dysuria, nausea/vomiting, flank pain, or abdominal pain.  No known STD exposure but she would like screening.  She has not taken any OTC medications for her symptoms since onset.  No other concerns at this time.   Vaginal Discharge   Past Medical History:  Diagnosis Date   Anemia    Anemia affecting pregnancy in third trimester 04/11/2018   Chlamydia trachomatis infection in mother during third trimester of pregnancy 06/07/2018   GERD (gastroesophageal reflux disease)    Miscarriage within last 12 months    Normal labor 06/27/2018    Patient Active Problem List   Diagnosis Date Noted   Supervision of other normal pregnancy, antepartum 06/28/2020   MDD (major depressive disorder), recurrent episode, severe (HCC) 10/16/2018    Past Surgical History:  Procedure Laterality Date   COLON SURGERY     Pt has colon widened when she was [age] months old   DILATION AND EVACUATION N/A 07/08/2017   Procedure: DILATATION AND EVACUATION;  Surgeon: Adam Phenix, MD;  Location: [facility];  Service: Gynecology;  Laterality: N/A;    OB History     Gravida  4   Para  1   Term  1   Preterm      AB  2   Living  1      SAB  1   IAB      Ectopic      Multiple  0   Live Births  1            Home Medications    Prior to Admission medications   Medication Sig Start Date End Date Taking? Authorizing Provider  benzonatate (TESSALON) 100 MG capsule Take 1 capsule (100 mg total) by mouth every 8 (eight) hours. Patient not taking: Reported on 07/30/2022 05/31/22   Elayne Snare K, DO  Blood Pressure Monitoring (BLOOD PRESSURE MONITOR AUTOMAT) DEVI 1 Device by Does not apply route daily. Automatic blood pressure cuff regular size. To monitor blood pressure regularly at home. ICD-10 code:Z34.90 06/28/20   [name], CNM  ciprofloxacin (CIPRO) 500 MG tablet Take 1 tablet (500 mg total) by mouth 2 (two) times daily. 09/26/22   Wallis Bamberg, PA-C  metroNIDAZOLE (FLAGYL) 500 MG tablet Take 1 tablet (500 mg total) by mouth 2 (two) times daily. 09/28/22   Lamptey, Britta Mccreedy, MD  Misc. Devices (GOJJI WEIGHT SCALE) MISC 1 Device by Does not apply route daily as needed. To weight self daily as needed at home. ICD-10 code: Z34.90 06/28/20   [name], CNM  naproxen (NAPROSYN) 375 MG tablet Take 1 tablet (375 mg total) by mouth 2 (two) times daily with a meal. 09/04/22   Wallis Bamberg, PA-C  ondansetron (ZOFRAN) 4 MG tablet Take 1 tablet (4 mg total) by mouth every 6 (six) hours. Patient not taking: Reported on 07/30/2022 05/31/22   Elayne Snare K, DO  ondansetron (ZOFRAN-ODT) 4 MG disintegrating tablet Take 1 tablet (4 mg total) by mouth every 8 (eight) hours as needed for nausea or vomiting. 07/30/22  Tilden Fossa, MD  valACYclovir (VALTREX) 1000 MG tablet Take 1 tablet (1,000 mg total) by mouth 2 (two) times daily. Patient not taking: Reported on 07/30/2022 05/03/21   Tilden Fossa, MD    Family History Family History  Problem Relation Age of Onset   Healthy Mother    Healthy Father     Social History Social History   Tobacco Use   Smoking status: Never   Smokeless tobacco: Never  Vaping Use   Vaping Use: Never used  Substance Use Topics   Alcohol use: No   Drug use: Not Currently    Types: Marijuana     Allergies   Banana and Other   Review of Systems Review of Systems  Genitourinary:  Positive for vaginal discharge.     Physical Exam Triage Vital Signs ED Triage Vitals  Enc Vitals Group     BP 11/20/22 1538 (!) 96/53     Pulse Rate  11/20/22 1537 72     Resp 11/20/22 1537 17     Temp 11/20/22 1537 99.2 F (37.3 C)     Temp Source 11/20/22 1537 Oral     SpO2 11/20/22 1537 98 %     Weight --      Height --      Head Circumference --      Peak Flow --      Pain Score 11/20/22 1536 4     Pain Loc --      Pain Edu? --      Excl. in GC? --    No data found.  Updated Vital Signs BP (!) 96/53   Pulse 72   Temp 99.2 F (37.3 C) (Oral)   Resp 17   LMP 11/02/2022 (Exact Date)   SpO2 98%   Visual Acuity Right Eye Distance:   Left Eye Distance:   Bilateral Distance:    Right Eye Near:   Left Eye Near:    Bilateral Near:     Physical Exam Vitals and nursing note reviewed.  Constitutional:      Appearance: Normal appearance.  HENT:     Head: Normocephalic and atraumatic.  Eyes:     Pupils: Pupils are equal, round, and reactive to light.  Cardiovascular:     Rate and Rhythm: Normal rate.  Pulmonary:     Effort: Pulmonary effort is normal.  Abdominal:     Tenderness: There is no right CVA tenderness or left CVA tenderness.  Skin:    General: Skin is warm and dry.  Neurological:     General: No focal deficit present.     Mental Status: She is alert and oriented to person, place, and time.  Psychiatric:        Mood and Affect: Mood normal.        Behavior: Behavior normal.      UC Treatments / Results  Labs (all labs ordered are listed, but only abnormal results are displayed) Labs Reviewed  POCT URINALYSIS DIP (MANUAL ENTRY) - Abnormal; Notable for the following components:      Result Value   Ketones, POC UA moderate (40) (*)    Leukocytes, UA Small (1+) (*)    All other components within normal limits  URINE CULTURE  RPR  HIV ANTIBODY (ROUTINE TESTING W REFLEX)  POCT URINE PREGNANCY  CERVICOVAGINAL ANCILLARY ONLY    EKG   Radiology No results found.  Procedures Procedures (including critical care time)  Medications Ordered in UC Medications - No data to  display  Initial  Impression / Assessment and Plan / UC Course  I have reviewed the triage vital signs and the nursing notes.  Pertinent labs & imaging results that were available during my care of the patient were reviewed by me and considered in my medical decision making (see chart for details).     Reviewed exam and symptoms with patient.  No red flags.  UA with small leuks otherwise unremarkable.  Will culture.  Patient prefers to await results prior to initiating any treatment.  STD testing is ordered and will also contact if these are positive.  She again would like to await results before initiating any treatment PCP or GYN follow-up if symptoms do not improve ER precautions reviewed and patient verbalized understanding Final Clinical Impressions(s) / UC Diagnoses   Final diagnoses:  Acute vaginitis  Leukocytes in urine  Screening examination for STD (sexually transmitted disease)     Discharge Instructions      The clinic will contact you with results of the testing done today if positive Follow-up with your PCP or gynecologist if your symptoms or not improving Please go to the emergency room if you develop any worsening symptoms Hope you feel better soon!    ED Prescriptions   None    PDMP not reviewed this encounter.   Radford Pax, NP 11/20/22 1556

## 2022-11-20 NOTE — ED Notes (Signed)
Unable to get blood draw after three attempts by 2 different clinical staff. Lennox Laity NP aware.

## 2022-11-20 NOTE — Discharge Instructions (Signed)
The clinic will contact you with results of the testing done today if positive Follow-up with your PCP or gynecologist if your symptoms or not improving Please go to the emergency room if you develop any worsening symptoms Hope you feel better soon!

## 2022-11-20 NOTE — ED Triage Notes (Signed)
Pt presents with c/o vaginal redness, swelling and yellow discharge X 2 days.   Denies itching and smell. Pt reports she is uncomfortable.

## 2022-11-22 LAB — URINE CULTURE: Culture: NO GROWTH

## 2022-11-23 ENCOUNTER — Telehealth (HOSPITAL_COMMUNITY): Payer: Self-pay | Admitting: Emergency Medicine

## 2022-11-23 LAB — CERVICOVAGINAL ANCILLARY ONLY
Bacterial Vaginitis (gardnerella): POSITIVE — AB
Candida Glabrata: NEGATIVE
Candida Vaginitis: POSITIVE — AB
Chlamydia: NEGATIVE
Comment: NEGATIVE
Comment: NEGATIVE
Comment: NEGATIVE
Comment: NEGATIVE
Comment: NEGATIVE
Comment: NORMAL
Neisseria Gonorrhea: NEGATIVE
Trichomonas: POSITIVE — AB

## 2022-11-23 MED ORDER — METRONIDAZOLE 500 MG PO TABS: 500.0000 mg | ORAL_TABLET | Freq: Two times a day (BID) | ORAL | 0 refills | Status: DC

## 2022-11-23 MED ORDER — FLUCONAZOLE 150 MG PO TABS: 150.0000 mg | ORAL_TABLET | Freq: Once | ORAL | 0 refills | Status: AC

## 2023-01-30 ENCOUNTER — Ambulatory Visit
Admission: EM | Admit: 2023-01-30 | Discharge: 2023-01-30 | Disposition: A | Discharge status: Home / Self Care | Payer: Medicaid Other | Attending: Internal Medicine | Admitting: Internal Medicine

## 2023-01-30 DIAGNOSIS — Z113 Encounter for screening for infections with a predominantly sexual mode of transmission: Secondary | ICD-10-CM | POA: Diagnosis not present

## 2023-01-30 NOTE — ED Provider Notes (Signed)
[facility]  Note:  This document was prepared using Conservation officer, historic buildings and may include unintentional dictation errors.  MRN: [id number] DOB: 12/31/2000  Subjective:   Victoria Frye is a 22 y.o. female presenting for STI screen.  Patient tested positive for trichomoniasis, bacterial vaginosis and yeast vaginitis 11/20/2022.  Would like to confirm clearance of infection. She was supposed to have completed metronidazole and fluconazole courses.  Denies fever, n/v, abdominal pain, pelvic pain, rashes, dysuria, urinary frequency, hematuria, vaginal discharge.  She just came off of her cycle, no chance of pregnancy.   No current facility-administered medications for this encounter.  Current Outpatient Medications:    benzonatate (TESSALON) 100 MG capsule, Take 1 capsule (100 mg total) by mouth every 8 (eight) hours. (Patient not taking: Reported on 07/30/2022), Disp: 21 capsule, Rfl: 0   Blood Pressure Monitoring (BLOOD PRESSURE MONITOR AUTOMAT) DEVI, 1 Device by Does not apply route daily. Automatic blood pressure cuff regular size. To monitor blood pressure regularly at home. ICD-10 code:Z34.90, Disp: 1 each, Rfl: 0   ciprofloxacin (CIPRO) 500 MG tablet, Take 1 tablet (500 mg total) by mouth 2 (two) times daily., Disp: 14 tablet, Rfl: 0   metroNIDAZOLE (FLAGYL) 500 MG tablet, Take 1 tablet (500 mg total) by mouth 2 (two) times daily., Disp: 14 tablet, Rfl: 0   Misc. Devices (GOJJI WEIGHT SCALE) MISC, 1 Device by Does not apply route daily as needed. To weight self daily as needed at home. ICD-10 code: Z34.90, Disp: 1 each, Rfl: 0   naproxen (NAPROSYN) 375 MG tablet, Take 1 tablet (375 mg total) by mouth 2 (two) times daily with a meal., Disp: 30 tablet, Rfl: 0   ondansetron (ZOFRAN) 4 MG tablet, Take 1 tablet (4 mg total) by mouth every 6 (six) hours. (Patient not taking: Reported on 07/30/2022), Disp: 12 tablet, Rfl: 0   ondansetron (ZOFRAN-ODT) 4 MG  disintegrating tablet, Take 1 tablet (4 mg total) by mouth every 8 (eight) hours as needed for nausea or vomiting., Disp: 12 tablet, Rfl: 0   valACYclovir (VALTREX) 1000 MG tablet, Take 1 tablet (1,000 mg total) by mouth 2 (two) times daily. (Patient not taking: Reported on 07/30/2022), Disp: 14 tablet, Rfl: 0   Allergies  Allergen Reactions   Banana Anaphylaxis and Itching   Other Itching    Onions    Past Medical History:  Diagnosis Date   Anemia    Anemia affecting pregnancy in third trimester 04/11/2018   Chlamydia trachomatis infection in mother during third trimester of pregnancy 06/07/2018   GERD (gastroesophageal reflux disease)    Miscarriage within last 12 months    Normal labor 06/27/2018     Past Surgical History:  Procedure Laterality Date   COLON SURGERY     Pt has colon widened when she was [age] months old   DILATION AND EVACUATION N/A 07/08/2017   Procedure: DILATATION AND EVACUATION;  Surgeon: Adam Phenix, MD;  Location: [facility];  Service: Gynecology;  Laterality: N/A;    Family History  Problem Relation Age of Onset   Healthy Mother    Healthy Father     Social History   Tobacco Use   Smoking status: Never   Smokeless tobacco: Never  Vaping Use   Vaping status: Never Used  Substance Use Topics   Alcohol use: No   Drug use: Yes    Types: Marijuana    ROS   Objective:   Vitals: BP 123/80 (BP Location: Right  Arm)   Pulse 90   Temp 98.2 F (36.8 C) (Oral)   Resp 16   LMP 01/22/2023   SpO2 98%   Physical Exam Constitutional:      General: She is not in acute distress.    Appearance: Normal appearance. She is well-developed. She is not ill-appearing, toxic-appearing or diaphoretic.  HENT:     Head: Normocephalic and atraumatic.     Nose: Nose normal.     Mouth/Throat:     Mouth: Mucous membranes are moist.  Eyes:     General: No scleral icterus.       Right eye: No discharge.        Left eye: No discharge.     Extraocular Movements:  Extraocular movements intact.  Cardiovascular:     Rate and Rhythm: Normal rate.  Pulmonary:     Effort: Pulmonary effort is normal.  Skin:    General: Skin is warm and dry.  Neurological:     General: No focal deficit present.     Mental Status: She is alert and oriented to person, place, and time.  Psychiatric:        Mood and Affect: Mood normal.        Behavior: Behavior normal.     Assessment and Plan :   PDMP not reviewed this encounter.  1. Screen for STD (sexually transmitted disease)    Will treat based off of results.      Wallis Bamberg, PA-C 01/30/23 1004

## 2023-01-30 NOTE — ED Triage Notes (Signed)
Pt requesting STD testing-denies sx-NAD-steady gait

## 2023-01-30 NOTE — Discharge Instructions (Addendum)
We will let you know about your test results tomorrow. If you have not heard from [place] by 3:00pm, then please give our clinic a call and we will do a thorough review with you.

## 2023-01-31 ENCOUNTER — Telehealth: Payer: Self-pay

## 2023-01-31 LAB — CERVICOVAGINAL ANCILLARY ONLY
Bacterial Vaginitis (gardnerella): POSITIVE — AB
Candida Glabrata: NEGATIVE
Candida Vaginitis: NEGATIVE
Chlamydia: NEGATIVE
Comment: NEGATIVE
Comment: NEGATIVE
Comment: NEGATIVE
Comment: NEGATIVE
Comment: NEGATIVE
Comment: NORMAL
Neisseria Gonorrhea: NEGATIVE
Trichomonas: NEGATIVE

## 2023-01-31 MED ORDER — METRONIDAZOLE 500 MG PO TABS: 500.0000 mg | ORAL_TABLET | Freq: Two times a day (BID) | ORAL | 0 refills | Status: AC

## 2023-01-31 NOTE — Telephone Encounter (Signed)
 Per protocol, pt requires tx with metronidazole. Attempted to reach patient x1. Unable to LVM.  Rx sent to pharmacy on file.

## 2023-03-07 ENCOUNTER — Ambulatory Visit
Admission: EM | Admit: 2023-03-07 | Discharge: 2023-03-07 | Disposition: A | Discharge status: Home / Self Care | Payer: Medicaid Other | Attending: Physician Assistant | Admitting: Physician Assistant

## 2023-03-07 DIAGNOSIS — N760 Acute vaginitis: Secondary | ICD-10-CM | POA: Diagnosis not present

## 2023-03-07 DIAGNOSIS — B9689 Other specified bacterial agents as the cause of diseases classified elsewhere: Secondary | ICD-10-CM | POA: Diagnosis not present

## 2023-03-07 MED ORDER — METRONIDAZOLE 500 MG PO TABS: 500.0000 mg | ORAL_TABLET | Freq: Two times a day (BID) | ORAL | 0 refills | Status: AC

## 2023-03-07 NOTE — ED Triage Notes (Signed)
Pt presents w/ c/o vag d/c and bleach smell X 3 days.  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   Home interventions: douche

## 2023-03-07 NOTE — ED Provider Notes (Signed)
[facility]    CSN: [id number] Arrival date & time: 03/07/23  0913      History   Chief Complaint Chief Complaint  Patient presents with   Vaginal Discharge   vaginal odor    HPI Victoria Frye is a 22 y.o. female.   Patient reports she has a vaginal discharge with a vaginal odor.  Patient reports the odor smells like bleach.  Patient reports she has had a history of bacterial vaginitis in the past and thinks that she may currently have bacterial vaginitis.  Patient denies any fever or chills she is not having any abdominal pain.  Patient is not aware of any STD exposures   Vaginal Discharge   Past Medical History:  Diagnosis Date   Anemia    Anemia affecting pregnancy in third trimester 04/11/2018   Chlamydia trachomatis infection in mother during third trimester of pregnancy 06/07/2018   GERD (gastroesophageal reflux disease)    Miscarriage within last 12 months    Normal labor 06/27/2018    Patient Active Problem List   Diagnosis Date Noted   Supervision of other normal pregnancy, antepartum 06/28/2020   MDD (major depressive disorder), recurrent episode, severe (HCC) 10/16/2018    Past Surgical History:  Procedure Laterality Date   COLON SURGERY     Pt has colon widened when she was [age] months old   DILATION AND EVACUATION N/A 07/08/2017   Procedure: DILATATION AND EVACUATION;  Surgeon: Adam Phenix, MD;  Location: [facility];  Service: Gynecology;  Laterality: N/A;    OB History     Gravida  4   Para  1   Term  1   Preterm      AB  2   Living  1      SAB  1   IAB      Ectopic      Multiple  0   Live Births  1            Home Medications    Prior to Admission medications   Medication Sig Start Date End Date Taking? Authorizing Provider  metroNIDAZOLE (FLAGYL) 500 MG tablet Take 1 tablet (500 mg total) by mouth 2 (two) times daily for 7 days. 03/07/23 03/14/23 Yes Cheron Schaumann K, PA-C  benzonatate (TESSALON) 100 MG  capsule Take 1 capsule (100 mg total) by mouth every 8 (eight) hours. Patient not taking: Reported on 07/30/2022 05/31/22   Elayne Snare K, DO  Blood Pressure Monitoring (BLOOD PRESSURE MONITOR AUTOMAT) DEVI 1 Device by Does not apply route daily. Automatic blood pressure cuff regular size. To monitor blood pressure regularly at home. ICD-10 code:Z34.90 06/28/20   [name], CNM  ciprofloxacin (CIPRO) 500 MG tablet Take 1 tablet (500 mg total) by mouth 2 (two) times daily. 09/26/22   Wallis Bamberg, PA-C  Misc. Devices (GOJJI WEIGHT SCALE) MISC 1 Device by Does not apply route daily as needed. To weight self daily as needed at home. ICD-10 code: Z34.90 06/28/20   [name], CNM  naproxen (NAPROSYN) 375 MG tablet Take 1 tablet (375 mg total) by mouth 2 (two) times daily with a meal. 09/04/22   Wallis Bamberg, PA-C  ondansetron (ZOFRAN) 4 MG tablet Take 1 tablet (4 mg total) by mouth every 6 (six) hours. Patient not taking: Reported on 07/30/2022 05/31/22   Elayne Snare K, DO  ondansetron (ZOFRAN-ODT) 4 MG disintegrating tablet Take 1 tablet (4 mg total) by mouth every 8 (eight) hours as needed for  nausea or vomiting. 07/30/22   Tilden Fossa, MD  valACYclovir (VALTREX) 1000 MG tablet Take 1 tablet (1,000 mg total) by mouth 2 (two) times daily. Patient not taking: Reported on 07/30/2022 05/03/21   Tilden Fossa, MD    Family History Family History  Problem Relation Age of Onset   Healthy Mother    Healthy Father     Social History Social History   Tobacco Use   Smoking status: Never   Smokeless tobacco: Never  Vaping Use   Vaping status: Never Used  Substance Use Topics   Alcohol use: No   Drug use: Yes    Types: Marijuana     Allergies   Banana and Other   Review of Systems Review of Systems  Genitourinary:  Positive for vaginal discharge.  All other systems reviewed and are negative.    Physical Exam Triage Vital Signs ED Triage Vitals [03/07/23 0921]  Encounter  Vitals Group     BP 104/71     Systolic BP Percentile      Diastolic BP Percentile      Pulse Rate 74     Resp 17     Temp 98.7 F (37.1 C)     Temp Source Oral     SpO2 98 %     Weight      Height      Head Circumference      Peak Flow      Pain Score 0     Pain Loc      Pain Education      Exclude from Growth Chart    No data found.  Updated Vital Signs BP 104/71 (BP Location: Right Arm)   Pulse 74   Temp 98.7 F (37.1 C) (Oral)   Resp 17   LMP 02/21/2023 (Approximate)   SpO2 98%   Visual Acuity Right Eye Distance:   Left Eye Distance:   Bilateral Distance:    Right Eye Near:   Left Eye Near:    Bilateral Near:     Physical Exam Vitals and nursing note reviewed.  Constitutional:      Appearance: She is well-developed.  HENT:     Head: Normocephalic.  Cardiovascular:     Rate and Rhythm: Normal rate.  Pulmonary:     Effort: Pulmonary effort is normal.  Abdominal:     General: There is no distension.  Musculoskeletal:        General: Normal range of motion.     Cervical back: Normal range of motion.  Skin:    General: Skin is warm.  Neurological:     General: No focal deficit present.     Mental Status: She is alert and oriented to person, place, and time.      UC Treatments / Results  Labs (all labs ordered are listed, but only abnormal results are displayed) Labs Reviewed  CERVICOVAGINAL ANCILLARY ONLY    EKG   Radiology No results found.  Procedures Procedures (including critical care time)  Medications Ordered in UC Medications - No data to display  Initial Impression / Assessment and Plan / UC Course  I have reviewed the triage vital signs and the nursing notes.  Pertinent labs & imaging results that were available during my care of the patient were reviewed by me and considered in my medical decision making (see chart for details).     Patient did a self swab.  Patient is given a prescription for metronidazole.  Patient is  advised to return if symptoms worsen or change. Final Clinical Impressions(s) / UC Diagnoses   Final diagnoses:  BV (bacterial vaginosis)   Discharge Instructions   None    ED Prescriptions     Medication Sig Dispense Auth. Provider   metroNIDAZOLE (FLAGYL) 500 MG tablet Take 1 tablet (500 mg total) by mouth 2 (two) times daily for 7 days. 14 tablet Elson Areas, [place]      PDMP not reviewed this encounter. An After Visit Summary was printed and given to the patient.       Elson Areas, [place] 03/07/23 1345

## 2023-03-08 ENCOUNTER — Telehealth: Payer: Self-pay

## 2023-03-08 ENCOUNTER — Other Ambulatory Visit: Payer: Self-pay

## 2023-03-08 ENCOUNTER — Encounter (HOSPITAL_BASED_OUTPATIENT_CLINIC_OR_DEPARTMENT_OTHER): Payer: Self-pay

## 2023-03-08 ENCOUNTER — Emergency Department (HOSPITAL_BASED_OUTPATIENT_CLINIC_OR_DEPARTMENT_OTHER)
Admission: EM | Admit: 2023-03-08 | Discharge: 2023-03-08 | Disposition: A | Discharge status: Home / Self Care | Payer: Medicaid Other | Attending: Emergency Medicine | Admitting: Emergency Medicine

## 2023-03-08 DIAGNOSIS — Z202 Contact with and (suspected) exposure to infections with a predominantly sexual mode of transmission: Secondary | ICD-10-CM | POA: Insufficient documentation

## 2023-03-08 LAB — CERVICOVAGINAL ANCILLARY ONLY
Bacterial Vaginitis (gardnerella): POSITIVE — AB
Candida Glabrata: NEGATIVE
Candida Vaginitis: NEGATIVE
Chlamydia: POSITIVE — AB
Comment: NEGATIVE
Comment: NEGATIVE
Comment: NEGATIVE
Comment: NEGATIVE
Comment: NEGATIVE
Comment: NORMAL
Neisseria Gonorrhea: NEGATIVE
Trichomonas: NEGATIVE

## 2023-03-08 LAB — HIV ANTIBODY (ROUTINE TESTING W REFLEX): HIV Screen 4th Generation wRfx: NONREACTIVE

## 2023-03-08 MED ORDER — DOXYCYCLINE HYCLATE 100 MG PO CAPS: 100.0000 mg | ORAL_CAPSULE | Freq: Two times a day (BID) | ORAL | 0 refills | Status: DC

## 2023-03-08 NOTE — ED Provider Notes (Signed)
[facility] Provider Note   CSN: [id number] Arrival date & time: 03/08/23  4098     History  Chief Complaint  Patient presents with   SEXUALLY TRANSMITTED DISEASE    Victoria Frye is a 22 y.o. female with a past medical history significant for depression who presents to the ED for HIV testing.  Patient had intercourse 3 weeks ago and for the past week her sexual partner has been complaining of testicular pain and she notes he googled his symptoms and was concerned he may have HIV. Supposedly sexual partner was tested 1 week ago; however has not gotten results yet. Patient is no longer speaking to partner. She was seen at urgent care yesterday where she had an STI panel drawn. Awaiting results. She has recurrent BV and was prescribed Flagyl yesterday at [facility].  Patient denies any vaginal discharge.  No pelvic or abdominal pain.  No fever or chills.  Patient is currently asymptomatic and stress requesting HIV testing.  History obtained from patient and past medical records. No interpreter used during encounter.       Home Medications Prior to Admission medications   Medication Sig Start Date End Date Taking? Authorizing Provider  benzonatate (TESSALON) 100 MG capsule Take 1 capsule (100 mg total) by mouth every 8 (eight) hours. Patient not taking: Reported on 07/30/2022 05/31/22   Elayne Snare K, DO  Blood Pressure Monitoring (BLOOD PRESSURE MONITOR AUTOMAT) DEVI 1 Device by Does not apply route daily. Automatic blood pressure cuff regular size. To monitor blood pressure regularly at home. ICD-10 code:Z34.90 06/28/20   [name], CNM  ciprofloxacin (CIPRO) 500 MG tablet Take 1 tablet (500 mg total) by mouth 2 (two) times daily. 09/26/22   Wallis Bamberg, PA-C  metroNIDAZOLE (FLAGYL) 500 MG tablet Take 1 tablet (500 mg total) by mouth 2 (two) times daily for 7 days. 03/07/23 03/14/23  Elson Areas, PA-C  Misc. Devices (GOJJI WEIGHT SCALE)  MISC 1 Device by Does not apply route daily as needed. To weight self daily as needed at home. ICD-10 code: Z34.90 06/28/20   [name], CNM  naproxen (NAPROSYN) 375 MG tablet Take 1 tablet (375 mg total) by mouth 2 (two) times daily with a meal. 09/04/22   Wallis Bamberg, PA-C  ondansetron (ZOFRAN) 4 MG tablet Take 1 tablet (4 mg total) by mouth every 6 (six) hours. Patient not taking: Reported on 07/30/2022 05/31/22   Elayne Snare K, DO  ondansetron (ZOFRAN-ODT) 4 MG disintegrating tablet Take 1 tablet (4 mg total) by mouth every 8 (eight) hours as needed for nausea or vomiting. 07/30/22   Tilden Fossa, MD  valACYclovir (VALTREX) 1000 MG tablet Take 1 tablet (1,000 mg total) by mouth 2 (two) times daily. Patient not taking: Reported on 07/30/2022 05/03/21   Tilden Fossa, MD      Allergies    Banana and Other    Review of Systems   Review of Systems  Constitutional:  Negative for fever.  Gastrointestinal:  Negative for abdominal pain.  Genitourinary:  Negative for dysuria, pelvic pain and vaginal discharge.    Physical Exam Updated Vital Signs BP 127/80   Pulse 84   Temp 98.4 F (36.9 C) (Oral)   Resp 16   Ht 5' (1.524 m)   Wt 44 kg   LMP 02/18/2023 (Approximate)   SpO2 100%   BMI 18.94 kg/m  Physical Exam Vitals and nursing note reviewed.  Constitutional:      General:  She is not in acute distress.    Appearance: She is not ill-appearing.  HENT:     Head: Normocephalic.  Eyes:     Pupils: Pupils are equal, round, and reactive to light.  Cardiovascular:     Rate and Rhythm: Normal rate and regular rhythm.     Pulses: Normal pulses.     Heart sounds: Normal heart sounds. No murmur heard.    No friction rub. No gallop.  Pulmonary:     Effort: Pulmonary effort is normal.     Breath sounds: Normal breath sounds.  Abdominal:     General: Abdomen is flat. There is no distension.     Palpations: Abdomen is soft.     Tenderness: There is no abdominal tenderness. There  is no guarding or rebound.  Musculoskeletal:        General: Normal range of motion.     Cervical back: Neck supple.  Skin:    General: Skin is warm and dry.  Neurological:     General: No focal deficit present.     Mental Status: She is alert.  Psychiatric:        Mood and Affect: Mood normal.        Behavior: Behavior normal.     ED Results / Procedures / Treatments   Labs (all labs ordered are listed, but only abnormal results are displayed) Labs Reviewed  HIV ANTIBODY (ROUTINE TESTING W REFLEX)    EKG None  Radiology No results found.  Procedures Procedures    Medications Ordered in ED Medications - No data to display  ED Course/ Medical Decision Making/ A&P                                 Medical Decision Making Amount and/or Complexity of Data Reviewed External Data Reviewed: notes.    Details: UC note from 28/81   22 year old female presents to the ED requesting HIV testing.  Had unprotected intercourse 3 weeks ago. Partner for the past week has been complaining of testicular pain. Partner awaiting HIV results. Patient was seen yesterday at [facility] where she had an STI panel and is awaiting results. HIV test not drawn at this time.  Patient is currently asymptomatic.  Was prescribed Flagyl yesterday at urgent care due to recurrent BV.  Denies vaginal discharge, pelvic pain, abdominal pain, fever, and dysuria.  Upon arrival, vitals all within normal limits.  Patient in no acute distress.  Abdomen soft, nondistended, nontender.  Low suspicion for acute abdomen.  Given no pelvic pain or vaginal discharge low suspicion for PID.  Patient denies any pregnancy concerns. HIV testing sent to the lab.  Patient out of window for prophylaxis HIV treatment. Advised patient to get retested for HIV 6 weeks after possible exposure. OBGYN number given to patient at discharge. Strict ED precautions discussed with patient. Patient states understanding and agrees to plan. Patient  discharged home in no acute distress and stable vitals         Final Clinical Impression(s) / ED Diagnoses Final diagnoses:  Possible exposure to STD    Rx / DC Orders ED Discharge Orders     None         Jesusita Oka 03/08/23 4166    Alvira Monday, MD 03/08/23 2319

## 2023-03-08 NOTE — ED Triage Notes (Signed)
The patient is concerned she has been exposed to STD and possibly HIV. No symptoms.

## 2023-03-08 NOTE — ED Notes (Signed)
RN attempted to draw labs without success.  2nd RN attempting

## 2023-03-08 NOTE — Discharge Instructions (Signed)
It was a pleasure taking care of you today.  As discussed, your STI panel from yesterday is pending.  You were tested for HIV today.  Results should be available within the next day.  Please get retested 6 weeks after exposure.  I have included the number of the OB/GYN.  Please call to schedule an appointment to establish care and for follow-up.  Return to the ER for any worsening symptoms.

## 2023-03-08 NOTE — Telephone Encounter (Signed)
Per protocol, pt requires tx with metronidazole. Attempted to reach patient x1. Unable to LVM.  Rx sent to pharmacy on file.

## 2023-03-12 ENCOUNTER — Telehealth: Payer: Self-pay | Admitting: Emergency Medicine

## 2023-03-12 ENCOUNTER — Telehealth: Payer: Medicaid Other | Admitting: Physician Assistant

## 2023-03-12 DIAGNOSIS — A64 Unspecified sexually transmitted disease: Secondary | ICD-10-CM

## 2023-03-12 DIAGNOSIS — Z789 Other specified health status: Secondary | ICD-10-CM

## 2023-03-12 MED ORDER — DOXYCYCLINE HYCLATE 100 MG PO TABS: 100.0000 mg | ORAL_TABLET | Freq: Two times a day (BID) | ORAL | 0 refills | Status: AC

## 2023-03-12 NOTE — Telephone Encounter (Signed)
Patient being treated with doxycycline capsules for chlamydia.  Patient states she cannot tolerate the capsules.  Prescription changed to tablets instead.

## 2023-03-12 NOTE — Progress Notes (Signed)
Because of need of different treatment for STI which we cannot do via e-visit, I feel your condition warrants further evaluation and I recommend that you be seen in a face to face visit. I would call the practice you were seen/treated at to let them know so they can prescribe alternative therapy.   NOTE: There will be NO CHARGE for this eVisit   If you are having a true medical emergency please call 911.      For an urgent face to face visit, [place] has eight urgent care centers for your convenience:   NEW!! [facility] at [facility] Get Driving Directions [phone] [address] [place], [address]    [facility] Health Urgent [facility] Center at [facility] Get Driving Directions [phone] [address] [place], [place] [address]   [facility] Health Urgent Care Center [facility]) Get Driving Directions [phone] [address] [place], [place] [address]  [facility] Health Urgent Care Center [facility]) Get Driving Directions [phone] [address] [place],  [place]  [address]  [facility] Health Urgent Care Center [facility] - at [employer]  [phone] [address] [place],  [place] [address]   [facility] Urgent Care at [facility] Get Driving Directions [phone] [address] [place], [place] [address]   [facility] Health Urgent Care at [facility] Get Driving Directions  [phone] [address] [place], [place] [address]   [facility] Health Urgent Care at [facility] Directions [phone] [address], [address] [place], [place] [address]  Your MyChart E-visit questionnaire answers were reviewed by a board certified advanced clinical practitioner to complete your personal care plan based on your specific symptoms.  Thank you for using e-Visits.

## 2023-06-18 ENCOUNTER — Ambulatory Visit
Admission: EM | Admit: 2023-06-18 | Discharge: 2023-06-18 | Disposition: A | Discharge status: Home / Self Care | Payer: Medicaid Other | Attending: Emergency Medicine | Admitting: Emergency Medicine

## 2023-06-18 DIAGNOSIS — Z113 Encounter for screening for infections with a predominantly sexual mode of transmission: Secondary | ICD-10-CM | POA: Insufficient documentation

## 2023-06-18 NOTE — ED Provider Notes (Signed)
[facility]    CSN: [id number] Arrival date & time: 06/18/23  0908      History   Chief Complaint Chief Complaint  Patient presents with   SEXUALLY TRANSMITTED DISEASE    HPI Victoria Frye is a 23 y.o. female.  Patient here for STD testing She had positive chlamydia 3 months ago. Finished doxy course. She would like to be tested today and make sure the chlamydia is gone. She has been sexually active She is not having symptoms. No discharge, itching, rash, dysuria LMP 1/10  Past Medical History:  Diagnosis Date   Anemia    Anemia affecting pregnancy in third trimester 04/11/2018   Chlamydia trachomatis infection in mother during third trimester of pregnancy 06/07/2018   GERD (gastroesophageal reflux disease)    Miscarriage within last 12 months    Normal labor 06/27/2018    Patient Active Problem List   Diagnosis Date Noted   Supervision of other normal pregnancy, antepartum 06/28/2020   MDD (major depressive disorder), recurrent episode, severe (HCC) 10/16/2018    Past Surgical History:  Procedure Laterality Date   COLON SURGERY     Pt has colon widened when she was [age] months old   DILATION AND EVACUATION N/A 07/08/2017   Procedure: DILATATION AND EVACUATION;  Surgeon: Adam Phenix, MD;  Location: [facility];  Service: Gynecology;  Laterality: N/A;    OB History     Gravida  4   Para  1   Term  1   Preterm      AB  2   Living  1      SAB  1   IAB      Ectopic      Multiple  0   Live Births  1            Home Medications    Prior to Admission medications   Medication Sig Start Date End Date Taking? Authorizing Provider  Blood Pressure Monitoring (BLOOD PRESSURE MONITOR AUTOMAT) DEVI 1 Device by Does not apply route daily. Automatic blood pressure cuff regular size. To monitor blood pressure regularly at home. ICD-10 code:Z34.90 06/28/20   [name], CNM  Misc. Devices (GOJJI WEIGHT SCALE) MISC 1 Device by Does not apply  route daily as needed. To weight self daily as needed at home. ICD-10 code: Z34.90 06/28/20   [name], CNM  naproxen (NAPROSYN) 375 MG tablet Take 1 tablet (375 mg total) by mouth 2 (two) times daily with a meal. 09/04/22   Wallis Bamberg, PA-C  ondansetron (ZOFRAN-ODT) 4 MG disintegrating tablet Take 1 tablet (4 mg total) by mouth every 8 (eight) hours as needed for nausea or vomiting. 07/30/22   Tilden Fossa, MD    Family History Family History  Problem Relation Age of Onset   Healthy Mother    Healthy Father     Social History Social History   Tobacco Use   Smoking status: Never   Smokeless tobacco: Never  Vaping Use   Vaping status: Never Used  Substance Use Topics   Alcohol use: No   Drug use: Yes    Types: Marijuana     Allergies   Banana and Other   Review of Systems Review of Systems Per HPI  Physical Exam Triage Vital Signs ED Triage Vitals  Encounter Vitals Group     BP 06/18/23 0947 105/70     Systolic BP Percentile --      Diastolic BP Percentile --  Pulse Rate 06/18/23 0947 95     Resp 06/18/23 0947 18     Temp 06/18/23 0947 98.1 F (36.7 C)     Temp Source 06/18/23 0947 Oral     SpO2 06/18/23 0947 98 %     Weight --      Height --      Head Circumference --      Peak Flow --      Pain Score 06/18/23 0946 0     Pain Loc --      Pain Education --      Exclude from Growth Chart --    No data found.  Updated Vital Signs BP 105/70 (BP Location: Right Arm)   Pulse 95   Temp 98.1 F (36.7 C) (Oral)   Resp 18   LMP 06/07/2023 (Exact Date)   SpO2 98%    Physical Exam Vitals and nursing note reviewed.  Constitutional:      General: She is not in acute distress.    Appearance: Normal appearance.  Cardiovascular:     Rate and Rhythm: Normal rate and regular rhythm.     Heart sounds: Normal heart sounds.  Pulmonary:     Effort: Pulmonary effort is normal.     Breath sounds: Normal breath sounds.  Neurological:     Mental Status:  She is alert and oriented to person, place, and time.      UC Treatments / Results  Labs (all labs ordered are listed, but only abnormal results are displayed) Labs Reviewed  CERVICOVAGINAL ANCILLARY ONLY    EKG   Radiology No results found.  Procedures Procedures (including critical care time)  Medications Ordered in UC Medications - No data to display  Initial Impression / Assessment and Plan / UC Course  I have reviewed the triage vital signs and the nursing notes.  Pertinent labs & imaging results that were available during my care of the patient were reviewed by me and considered in my medical decision making (see chart for details).  Cytology swab is pending  No questions at this time  Final Clinical Impressions(s) / UC Diagnoses   Final diagnoses:  Screen for STD (sexually transmitted disease)     Discharge Instructions      We will call you if anything on your swab returns positive. You can also see these results on MyChart. Please abstain from sexual intercourse until your results return.     ED Prescriptions   None    PDMP not reviewed this encounter.   Marlow Baars, [place] 06/18/23 1034

## 2023-06-18 NOTE — Discharge Instructions (Signed)
We will call you if anything on your swab returns positive. You can also see these results on MyChart. Please abstain from sexual intercourse until your results return.

## 2023-06-18 NOTE — ED Triage Notes (Signed)
Pt presents for STD testing. Tested positive with Chlamydia 3 months ago, and wants retesting.   Denies sxs.

## 2023-06-19 LAB — CERVICOVAGINAL ANCILLARY ONLY
Chlamydia: NEGATIVE
Comment: NEGATIVE
Comment: NEGATIVE
Comment: NORMAL
Neisseria Gonorrhea: NEGATIVE
Trichomonas: NEGATIVE

## 2023-09-05 ENCOUNTER — Other Ambulatory Visit: Payer: Self-pay

## 2023-09-05 ENCOUNTER — Encounter (HOSPITAL_BASED_OUTPATIENT_CLINIC_OR_DEPARTMENT_OTHER): Payer: Self-pay | Admitting: Emergency Medicine

## 2023-09-05 ENCOUNTER — Emergency Department (HOSPITAL_BASED_OUTPATIENT_CLINIC_OR_DEPARTMENT_OTHER)
Admission: EM | Admit: 2023-09-05 | Discharge: 2023-09-05 | Disposition: A | Discharge status: Home / Self Care | Attending: Emergency Medicine | Admitting: Emergency Medicine

## 2023-09-05 ENCOUNTER — Emergency Department (HOSPITAL_BASED_OUTPATIENT_CLINIC_OR_DEPARTMENT_OTHER)

## 2023-09-05 DIAGNOSIS — O99611 Diseases of the digestive system complicating pregnancy, first trimester: Secondary | ICD-10-CM | POA: Insufficient documentation

## 2023-09-05 DIAGNOSIS — O219 Vomiting of pregnancy, unspecified: Secondary | ICD-10-CM | POA: Insufficient documentation

## 2023-09-05 DIAGNOSIS — O99111 Other diseases of the blood and blood-forming organs and certain disorders involving the immune mechanism complicating pregnancy, first trimester: Secondary | ICD-10-CM | POA: Diagnosis not present

## 2023-09-05 DIAGNOSIS — O209 Hemorrhage in early pregnancy, unspecified: Secondary | ICD-10-CM | POA: Insufficient documentation

## 2023-09-05 DIAGNOSIS — Z3A01 Less than 8 weeks gestation of pregnancy: Secondary | ICD-10-CM | POA: Insufficient documentation

## 2023-09-05 DIAGNOSIS — N83202 Unspecified ovarian cyst, left side: Secondary | ICD-10-CM | POA: Diagnosis not present

## 2023-09-05 DIAGNOSIS — D72819 Decreased white blood cell count, unspecified: Secondary | ICD-10-CM | POA: Insufficient documentation

## 2023-09-05 DIAGNOSIS — K429 Umbilical hernia without obstruction or gangrene: Secondary | ICD-10-CM | POA: Insufficient documentation

## 2023-09-05 DIAGNOSIS — O4690 Antepartum hemorrhage, unspecified, unspecified trimester: Secondary | ICD-10-CM

## 2023-09-05 DIAGNOSIS — Z3A00 Weeks of gestation of pregnancy not specified: Secondary | ICD-10-CM | POA: Diagnosis not present

## 2023-09-05 DIAGNOSIS — O3481 Maternal care for other abnormalities of pelvic organs, first trimester: Secondary | ICD-10-CM | POA: Diagnosis not present

## 2023-09-05 LAB — URINALYSIS, ROUTINE W REFLEX MICROSCOPIC
Bilirubin Urine: NEGATIVE
Glucose, UA: NEGATIVE mg/dL
Ketones, ur: 40 mg/dL — AB
Nitrite: NEGATIVE
Protein, ur: 30 mg/dL — AB
Specific Gravity, Urine: >=1.03 (ref 1.005–1.030)
pH: 6.5 (ref 5.0–8.0)

## 2023-09-05 LAB — CBC WITH DIFFERENTIAL/PLATELET
Abs Immature Granulocytes: 0.01 10*3/uL (ref 0.00–0.07)
Basophils Absolute: 0 10*3/uL (ref 0.0–0.1)
Basophils Relative: 1 %
Eosinophils Absolute: 0.1 10*3/uL (ref 0.0–0.5)
Eosinophils Relative: 2 %
HCT: 37.1 % (ref 36.0–46.0)
Hemoglobin: 11.9 g/dL — ABNORMAL LOW (ref 12.0–15.0)
Immature Granulocytes: 0 %
Lymphocytes Relative: 43 %
Lymphs Abs: 1.5 10*3/uL (ref 0.7–4.0)
MCH: 26.4 pg (ref 26.0–34.0)
MCHC: 32.1 g/dL (ref 30.0–36.0)
MCV: 82.4 fL (ref 80.0–100.0)
Monocytes Absolute: 0.3 10*3/uL (ref 0.1–1.0)
Monocytes Relative: 8 %
Neutro Abs: 1.5 10*3/uL — ABNORMAL LOW (ref 1.7–7.7)
Neutrophils Relative %: 46 %
Platelets: 285 10*3/uL (ref 150–400)
RBC: 4.5 MIL/uL (ref 3.87–5.11)
RDW: 13.3 % (ref 11.5–15.5)
WBC: 3.4 10*3/uL — ABNORMAL LOW (ref 4.0–10.5)
nRBC: 0 % (ref 0.0–0.2)

## 2023-09-05 LAB — COMPREHENSIVE METABOLIC PANEL WITH GFR
ALT: 16 U/L (ref 0–44)
AST: 21 U/L (ref 15–41)
Albumin: 4.6 g/dL (ref 3.5–5.0)
Alkaline Phosphatase: 36 U/L — ABNORMAL LOW (ref 38–126)
Anion gap: 11 (ref 5–15)
BUN: 12 mg/dL (ref 6–20)
CO2: 23 mmol/L (ref 22–32)
Calcium: 9.2 mg/dL (ref 8.9–10.3)
Chloride: 104 mmol/L (ref 98–111)
Creatinine, Ser: 0.67 mg/dL (ref 0.44–1.00)
GFR, Estimated: >60 mL/min (ref >60)
Glucose, Bld: 83 mg/dL (ref 70–99)
Potassium: 3.1 mmol/L — ABNORMAL LOW (ref 3.5–5.1)
Sodium: 138 mmol/L (ref 135–145)
Total Bilirubin: 1.1 mg/dL (ref 0.0–1.2)
Total Protein: 8.4 g/dL — ABNORMAL HIGH (ref 6.5–8.1)

## 2023-09-05 LAB — URINALYSIS, MICROSCOPIC (REFLEX)

## 2023-09-05 LAB — HCG, QUANTITATIVE, PREGNANCY: hCG, Beta Chain, Quant, S: 3196 m[IU]/mL — ABNORMAL HIGH (ref <5)

## 2023-09-05 NOTE — ED Provider Notes (Cosign Needed Addendum)
 [facility] [facility] Provider Note   CSN: [id number] Arrival date & time: 09/05/23  0945     History Miscarriage  Chief Complaint  Patient presents with   Vaginal Bleeding    Victoria Frye is a 23 y.o. female.  23 y.o G4P3 female currently <[redacted] weeks pregnant presents to the ED with a chief plaint of vaginal bleeding which began on April 5.  Patient's last menstrual cycle was earlier in March and ended on March 7.  She reports testing positive for pregnancy on April 4, since then she has had some light pink color discharge and bleeding.  States last night she noted her pad to be covered with blood, reports that the bleeding has increased.  2 days ago she did have some lower abdominal cramping and felt unwell.  She did not take any medication for her symptoms.  Patient does endorse 1 episode of nonbilious, nonbloody emesis.  She does have a prior history of an ectopic pregnancy.  She used to be followed by [facility] but has not seen them in some time.  Denies any vaginal discharge, no urinary symptoms, no other complaints.  The history is provided by the patient.  Vaginal Bleeding Quality:  Bright red Severity:  Mild Onset quality:  Sudden Duration:  1 day Timing:  Constant Chronicity:  New Menstrual history:  Regular Possible pregnancy: yes   Ineffective treatments:  None tried Associated symptoms: abdominal pain and nausea   Associated symptoms: no fever        Home Medications Prior to Admission medications   Medication Sig Start Date End Date Taking? Authorizing Provider  Blood Pressure Monitoring (BLOOD PRESSURE MONITOR AUTOMAT) DEVI 1 Device by Does not apply route daily. Automatic blood pressure cuff regular size. To monitor blood pressure regularly at home. ICD-10 code:Z34.90 06/28/20   [name], CNM  Misc. Devices (GOJJI WEIGHT SCALE) MISC 1 Device by Does not apply route daily as needed. To weight self daily as needed  at home. ICD-10 code: Z34.90 06/28/20   [name], CNM  naproxen (NAPROSYN) 375 MG tablet Take 1 tablet (375 mg total) by mouth 2 (two) times daily with a meal. 09/04/22   Wallis Bamberg, PA-C  ondansetron (ZOFRAN-ODT) 4 MG disintegrating tablet Take 1 tablet (4 mg total) by mouth every 8 (eight) hours as needed for nausea or vomiting. 07/30/22   Tilden Fossa, MD      Allergies    Banana and Other    Review of Systems   Review of Systems  Constitutional:  Negative for chills and fever.  HENT:  Negative for sore throat.   Respiratory:  Negative for shortness of breath.   Cardiovascular:  Negative for chest pain.  Gastrointestinal:  Positive for abdominal pain, nausea and vomiting.  Genitourinary:  Positive for vaginal bleeding.  All other systems reviewed and are negative.   Physical Exam Updated Vital Signs BP (!) 101/55 (BP Location: Left Arm)   Pulse 93   Temp 98.7 F (37.1 C)   Resp 18   LMP 08/02/2023 (Approximate)   SpO2 99%  Physical Exam Vitals and nursing note reviewed.  Constitutional:      Appearance: Normal appearance.  HENT:     Head: Normocephalic and atraumatic.     Mouth/Throat:     Mouth: Mucous membranes are moist.  Cardiovascular:     Rate and Rhythm: Normal rate.  Pulmonary:     Effort: Pulmonary effort is normal.  Abdominal:  General: Abdomen is flat.     Hernia: A hernia is present. Hernia is present in the umbilical area.     Comments: Abdomen nontender to palpation, no guarding, no rebound.  Musculoskeletal:     Cervical back: Normal range of motion and neck supple.  Skin:    General: Skin is dry.  Neurological:     Mental Status: She is alert and oriented to person, place, and time.     ED Results / Procedures / Treatments   Labs (all labs ordered are listed, but only abnormal results are displayed) Labs Reviewed  CBC WITH DIFFERENTIAL/PLATELET - Abnormal; Notable for the following components:      Result Value   WBC 3.4 (*)     Hemoglobin 11.9 (*)    Neutro Abs 1.5 (*)    All other components within normal limits  COMPREHENSIVE METABOLIC PANEL WITH GFR - Abnormal; Notable for the following components:   Potassium 3.1 (*)    Total Protein 8.4 (*)    Alkaline Phosphatase 36 (*)    All other components within normal limits  HCG, QUANTITATIVE, PREGNANCY - Abnormal; Notable for the following components:   hCG, Beta Chain, Quant, S 3,196 (*)    All other components within normal limits  URINALYSIS, ROUTINE W REFLEX MICROSCOPIC - Abnormal; Notable for the following components:   APPearance HAZY (*)    Hgb urine dipstick LARGE (*)    Ketones, ur 40 (*)    Protein, ur 30 (*)    Leukocytes,Ua SMALL (*)    All other components within normal limits  URINALYSIS, MICROSCOPIC (REFLEX) - Abnormal; Notable for the following components:   Bacteria, UA FEW (*)    All other components within normal limits  URINE CULTURE    EKG None  Radiology US OB LESS THAN 14 WEEKS WITH OB TRANSVAGINAL Result Date: 09/05/2023 CLINICAL DATA:  Vaginal bleeding during pregnancy EXAM: OBSTETRIC <14 WK [place] AND TRANSVAGINAL OB US TECHNIQUE: Both transabdominal and transvaginal ultrasound examinations were performed for complete evaluation of the gestation as well as the maternal uterus, adnexal regions, and pelvic cul-de-sac. Transvaginal technique was performed to assess early pregnancy. COMPARISON:  07/29/2022 FINDINGS: Uterus and endometrium transabdominal images are limited by contracted urinary bladder. Uterus grossly measures 5.3 x 8.0 by 5.6 cm. Along the endometrium is some free fluid as well as a rounded cystic structure. If this is an intrauterine pregnancy, mean sac diameter of 3.7 mm corresponding to 5 weeks and 1 day. No clear yolk sac or fetal pole however. Difficult to confirm as an IUP but based on appearance would favor such. Ovaries: Left ovary measures 3.2 x 3.2 by 2.1 cm. Small cystic area in the left ovary is complex measuring 2.2  cm. Right ovary measures 3.7 x 2.3 by 1.9 cm. Small follicles are identified. No significant free fluid in the pelvis. IMPRESSION: Rounded fluid collection along the endometrium. No fetal pole or yolk sac. There is some adjacent fluid as well in the endometrium. Based on appearance favor this being an IUP, possible double decidual sac sign, although difficult to confirm on the basis of this ultrasound. In principle ectopic is difficult to completely exclude. Recommend close follow-up with serial beta HCG and ultrasound. Electronically Signed   By: Karen Kays M.D.   On: 09/05/2023 13:30    Procedures Procedures    Medications Ordered in ED Medications - No data to display  ED Course/ Medical Decision Making/ A&P Clinical Course as of 09/05/23 1339  Thu Sep 05, 2023  1112 HCG, Beta Chain, Quant, S(!): 3,196 [JS]  1213 HCG, Beta Chain, Quant, S(!): 3,196 [JS]    Clinical Course User Index [JS] Claude Manges, PA-C                                 Medical Decision Making Amount and/or Complexity of Data Reviewed Labs: ordered. Decision-making details documented in ED Course. Radiology: ordered.   This patient presents to the ED for concern of vaginal bleeding, this involves a number of treatment options, and is a complaint that carries with it a high risk of complications and morbidity.  The differential diagnosis includes miscarriage, ectopic pregnancy versus subchorionic hematoma.   Co morbidities: Discussed in HPI   Brief History:  See HPI.  EMR reviewed including pt PMHx, past surgical history and past visits to ER.   See HPI for more details   Lab Tests:  I ordered and independently interpreted labs.  The pertinent results include:    I personally reviewed all laboratory work and imaging. Metabolic panel without any acute abnormality specifically kidney function within normal limits and no significant electrolyte abnormalities. CBC without leukocytosis or significant  anemia.   Imaging Studies:  Ultrasound pelvis was ordered to rule out ectopic versus threatened miscarriage.  However, due to our extensive delay in imaging reading, patient was unable to obtain her results at this time.  She does have a toddler with her in the room, I have agreed to call patient with her results and she is agreeable of returning to the emergency department.  Reevaluation:  After the interventions noted above I re-evaluated patient and found that they have :stayed the same  Social Determinants of Health:  The patient's social determinants of health were a factor in the care of this patient  Problem List / ED Course:  Patient presents to the ED with a history of vaginal bleeding that began approximately 6 days ago.  She does report taking a pregnancy test 7 days ago.  States since then she has has had some spotting spontaneously, she does have a prior history of a miscarriage therefore she was concerned that this may be happening.  She has not scheduled an appoint with an OB/GYN yet, previously followed by [facility].  She did have some abdominal cramping but this has not subsided.  She did not take any medication for this.  Interpretation of her blood work reveals CMP with no electrolyte derangement although slight decrease in potassium.  LFTs are within normal limits, creatinine levels normal.  CBC with leukopenia, and decrease in her hemoglobin.  Her beta hCG is 3, 196, she had a period at the beginning of March, suspect she is likely under [redacted] weeks pregnant according to this. UA does have large hemoglobin, small leukocytes but no nitrites and she is not having any urinary symptoms at this time in the setting of asymptomatic bacteriuria in pregnancy will send for culture prior to treatment at this time.  I did obtain an ultrasound to rule out any subchronic hemorrhage, versus miscarriage, versus ectopic.  However, due to limitations in staffing along with timing patient  has waited approximately 3 hours since the exam was performed and is opting to go home at this time. I did discuss with patient the risks and benefits of doing this, however I have agreed to call her with results as soon as they have been posted.  She is agreeable of this at this time.  Return precautions discussed at length, patient hemodynamically stable for discharge.   1:39 PM I personally called patient and discussed the results of her ultrasound at this time, I did recommend her to have serial beta repeats in the upcoming week.  She is agreeable of this at this time.  [facility]   Dispostion:  After consideration of the diagnostic results and the patients response to treatment, I feel that the patent would benefit from   Portions of this note were generated with Dragon dictation software. Dictation errors may occur despite best attempts at proofreading.   Final Clinical Impression(s) / ED Diagnoses Final diagnoses:  Vaginal bleeding in pregnancy    Rx / DC Orders ED Discharge Orders     None         Claude Manges, PA-C 09/05/23 1319    Claude Manges, PA-C 09/05/23 1340    Rondel Baton, MD 09/06/23 1724

## 2023-09-05 NOTE — ED Triage Notes (Signed)
 States increased vaginal bleeding x 4 days. Had + preg test at home last week. Denies ABD pain.Marland Kitchen

## 2023-09-05 NOTE — Discharge Instructions (Signed)
 You will be called with the notes of your ultrasound as soon as they are available to me.  You may need to return to the emergency department.  You need to schedule an appointment with an OB/GYN as soon as possible.

## 2023-09-07 LAB — URINE CULTURE

## 2023-09-21 ENCOUNTER — Other Ambulatory Visit: Payer: Self-pay

## 2023-09-21 ENCOUNTER — Emergency Department (HOSPITAL_BASED_OUTPATIENT_CLINIC_OR_DEPARTMENT_OTHER)
Admission: EM | Admit: 2023-09-21 | Discharge: 2023-09-21 | Disposition: A | Discharge status: Home / Self Care | Attending: Emergency Medicine | Admitting: Emergency Medicine

## 2023-09-21 ENCOUNTER — Emergency Department (HOSPITAL_BASED_OUTPATIENT_CLINIC_OR_DEPARTMENT_OTHER)

## 2023-09-21 ENCOUNTER — Encounter (HOSPITAL_BASED_OUTPATIENT_CLINIC_OR_DEPARTMENT_OTHER): Payer: Self-pay | Admitting: Emergency Medicine

## 2023-09-21 DIAGNOSIS — Z3201 Encounter for pregnancy test, result positive: Secondary | ICD-10-CM | POA: Diagnosis not present

## 2023-09-21 DIAGNOSIS — Z3A01 Less than 8 weeks gestation of pregnancy: Secondary | ICD-10-CM | POA: Insufficient documentation

## 2023-09-21 DIAGNOSIS — Z3490 Encounter for supervision of normal pregnancy, unspecified, unspecified trimester: Secondary | ICD-10-CM

## 2023-09-21 DIAGNOSIS — N8312 Corpus luteum cyst of left ovary: Secondary | ICD-10-CM | POA: Diagnosis not present

## 2023-09-21 DIAGNOSIS — D649 Anemia, unspecified: Secondary | ICD-10-CM | POA: Insufficient documentation

## 2023-09-21 DIAGNOSIS — O26891 Other specified pregnancy related conditions, first trimester: Secondary | ICD-10-CM | POA: Insufficient documentation

## 2023-09-21 DIAGNOSIS — O26851 Spotting complicating pregnancy, first trimester: Secondary | ICD-10-CM | POA: Insufficient documentation

## 2023-09-21 DIAGNOSIS — O3481 Maternal care for other abnormalities of pelvic organs, first trimester: Secondary | ICD-10-CM | POA: Diagnosis not present

## 2023-09-21 DIAGNOSIS — O208 Other hemorrhage in early pregnancy: Secondary | ICD-10-CM | POA: Diagnosis not present

## 2023-09-21 LAB — BASIC METABOLIC PANEL WITH GFR
Anion gap: 14 (ref 5–15)
BUN: 9 mg/dL (ref 6–20)
CO2: 21 mmol/L — ABNORMAL LOW (ref 22–32)
Calcium: 9.6 mg/dL (ref 8.9–10.3)
Chloride: 102 mmol/L (ref 98–111)
Creatinine, Ser: 0.58 mg/dL (ref 0.44–1.00)
GFR, Estimated: >60 mL/min (ref >60)
Glucose, Bld: 75 mg/dL (ref 70–99)
Potassium: 3.7 mmol/L (ref 3.5–5.1)
Sodium: 137 mmol/L (ref 135–145)

## 2023-09-21 LAB — CBC WITH DIFFERENTIAL/PLATELET
Abs Immature Granulocytes: 0.01 10*3/uL (ref 0.00–0.07)
Basophils Absolute: 0 10*3/uL (ref 0.0–0.1)
Basophils Relative: 1 %
Eosinophils Absolute: 0.1 10*3/uL (ref 0.0–0.5)
Eosinophils Relative: 2 %
HCT: 35 % — ABNORMAL LOW (ref 36.0–46.0)
Hemoglobin: 11.5 g/dL — ABNORMAL LOW (ref 12.0–15.0)
Immature Granulocytes: 0 %
Lymphocytes Relative: 39 %
Lymphs Abs: 1.9 10*3/uL (ref 0.7–4.0)
MCH: 27 pg (ref 26.0–34.0)
MCHC: 32.9 g/dL (ref 30.0–36.0)
MCV: 82.2 fL (ref 80.0–100.0)
Monocytes Absolute: 0.4 10*3/uL (ref 0.1–1.0)
Monocytes Relative: 8 %
Neutro Abs: 2.4 10*3/uL (ref 1.7–7.7)
Neutrophils Relative %: 50 %
Platelets: 264 10*3/uL (ref 150–400)
RBC: 4.26 MIL/uL (ref 3.87–5.11)
RDW: 13.2 % (ref 11.5–15.5)
WBC: 4.8 10*3/uL (ref 4.0–10.5)
nRBC: 0 % (ref 0.0–0.2)

## 2023-09-21 LAB — URINALYSIS, ROUTINE W REFLEX MICROSCOPIC
Bilirubin Urine: NEGATIVE
Glucose, UA: NEGATIVE mg/dL
Hgb urine dipstick: NEGATIVE
Ketones, ur: NEGATIVE mg/dL
Nitrite: NEGATIVE
Protein, ur: NEGATIVE mg/dL
Specific Gravity, Urine: 1.025 (ref 1.005–1.030)
pH: 6.5 (ref 5.0–8.0)

## 2023-09-21 LAB — URINALYSIS, MICROSCOPIC (REFLEX)

## 2023-09-21 LAB — HCG, QUANTITATIVE, PREGNANCY: hCG, Beta Chain, Quant, S: 52757 m[IU]/mL — ABNORMAL HIGH (ref <5)

## 2023-09-21 NOTE — ED Notes (Signed)
Patient returned from US via wheelchair.

## 2023-09-21 NOTE — ED Notes (Signed)
Patient transported to Ultrasound via wheelchair.

## 2023-09-21 NOTE — Discharge Instructions (Signed)
 Today you are seen for an intrauterine pregnancy.  Please follow-up with OB/GYN for further evaluation and treatment.  Please return to the ED if you have pelvic cramping, uncontrollable vomiting, or vaginal bleeding.  Thank you for letting us  treat you today. After reviewing your labs and imaging, I feel you are safe to go home. Please follow up with your PCP in the next several days and provide them with your records from this visit. Return to the Emergency Room if pain becomes severe or symptoms worsen.

## 2023-09-21 NOTE — ED Provider Notes (Signed)
 [place] [facility] Provider Note   CSN: [id number] Arrival date & time: 09/21/23  1424     History  Chief Complaint  Patient presents with   Possible Pregnancy    Victoria Frye is a 23 y.o. female presents today for concern for miscarriage.  Patient was seen on 4/10 for vaginal bleeding while pregnant and did not follow-up with OB/GYN to have beta hCG level trended.  Patient denies bleeding or pain at this time.  Patient does endorse intermittent very mild cramping and nausea.   Possible Pregnancy       Home Medications Prior to Admission medications   Medication Sig Start Date End Date Taking? Authorizing Provider  Blood Pressure Monitoring (BLOOD PRESSURE MONITOR AUTOMAT) DEVI 1 Device by Does not apply route daily. Automatic blood pressure cuff regular size. To monitor blood pressure regularly at home. ICD-10 code:Z34.90 06/28/20   Almond Army, CNM  Misc. Devices (GOJJI WEIGHT SCALE) MISC 1 Device by Does not apply route daily as needed. To weight self daily as needed at home. ICD-10 code: Z34.90 06/28/20   Dawson, Rolitta, CNM  naproxen  (NAPROSYN ) 375 MG tablet Take 1 tablet (375 mg total) by mouth 2 (two) times daily with a meal. 09/04/22   Adolph Hoop, PA-C  ondansetron  (ZOFRAN -ODT) 4 MG disintegrating tablet Take 1 tablet (4 mg total) by mouth every 8 (eight) hours as needed for nausea or vomiting. 07/30/22   Kelsey Patricia, MD      Allergies    Banana and Other    Review of Systems   Review of Systems  Gastrointestinal:  Positive for nausea.  Genitourinary:  Positive for pelvic pain.  All other systems reviewed and are negative.   Physical Exam Updated Vital Signs BP (!) 95/48   Pulse 74   Temp 98.7 F (37.1 C) (Oral)   Resp 20   Ht 5' (1.524 m)   Wt 44 kg   LMP 08/02/2023 (Approximate)   SpO2 100%   BMI 18.94 kg/m  Physical Exam Vitals and nursing note reviewed.  Constitutional:      General: She is not in acute  distress.    Appearance: Normal appearance. She is well-developed. She is not ill-appearing, toxic-appearing or diaphoretic.  HENT:     Head: Normocephalic and atraumatic.     Right Ear: External ear normal.     Left Ear: External ear normal.  Eyes:     Conjunctiva/sclera: Conjunctivae normal.  Cardiovascular:     Rate and Rhythm: Normal rate and regular rhythm.     Pulses: Normal pulses.  Pulmonary:     Effort: Pulmonary effort is normal. No respiratory distress.  Abdominal:     Palpations: Abdomen is soft.     Tenderness: There is no abdominal tenderness.  Musculoskeletal:        General: No swelling.     Cervical back: Neck supple.  Skin:    General: Skin is warm and dry.     Capillary Refill: Capillary refill takes less than 2 seconds.  Neurological:     General: No focal deficit present.     Mental Status: She is alert.  Psychiatric:        Mood and Affect: Mood normal.     ED Results / Procedures / Treatments   Labs (all labs ordered are listed, but only abnormal results are displayed) Labs Reviewed  CBC WITH DIFFERENTIAL/PLATELET - Abnormal; Notable for the following components:      Result Value  Hemoglobin 11.5 (*)    HCT 35.0 (*)    All other components within normal limits  BASIC METABOLIC PANEL WITH GFR - Abnormal; Notable for the following components:   CO2 21 (*)    All other components within normal limits  HCG, QUANTITATIVE, PREGNANCY - Abnormal; Notable for the following components:   hCG, Beta Chain, Quant, S 52,757 (*)    All other components within normal limits  URINALYSIS, ROUTINE W REFLEX MICROSCOPIC - Abnormal; Notable for the following components:   Leukocytes,Ua SMALL (*)    All other components within normal limits  URINALYSIS, MICROSCOPIC (REFLEX) - Abnormal; Notable for the following components:   Bacteria, UA RARE (*)    All other components within normal limits    EKG None  Radiology US  OB LESS THAN 14 WEEKS WITH OB  TRANSVAGINAL Result Date: 09/21/2023 CLINICAL DATA:  Initial evaluation for early pregnancy, unknown location. EXAM: OBSTETRIC <14 WK US  AND TRANSVAGINAL OB US  TECHNIQUE: Both transabdominal and transvaginal ultrasound examinations were performed for complete evaluation of the gestation as well as the maternal uterus, adnexal regions, and pelvic cul-de-sac. Transvaginal technique was performed to assess early pregnancy. COMPARISON:  Prior ultrasound from 09/05/2023 FINDINGS: Intrauterine gestational sac: Single Yolk sac:  Present Embryo:  Present Cardiac Activity: Present Heart Rate: 135 bpm CRL:  10.4 mm   7 w   1 d                  US  EDC: 05/08/2024 Subchorionic hemorrhage: 1.7 x 0.7 x 2.4 cm small to moderate-sized subchorionic hemorrhage seen along the right and superior aspect of the gestational sac. No mass effect. Maternal uterus/adnexae: Right ovary within normal limits. 2.2 cm complex left ovarian cyst, likely a corpus luteal cyst. No other adnexal mass or free fluid. Mild prominence of the pelvic vasculature noted within the adnexa bilaterally. IMPRESSION: 1. Single viable IUP, estimated gestational age [redacted] weeks and 1 day by crown-rump length, with ultrasound EDC of 05/08/2024. 2. Small to moderate-sized subchorionic hemorrhage as above. 3. 2.2 cm left ovarian corpus luteal cyst. Electronically Signed   By: Virgia Griffins M.D.   On: 09/21/2023 17:53    Procedures Procedures    Medications Ordered in ED Medications - No data to display  ED Course/ Medical Decision Making/ A&P                                 Medical Decision Making Amount and/or Complexity of Data Reviewed Radiology: ordered.   This patient presents to the ED for concern of pregnancy concern differential diagnosis includes ectopic pregnancy, threatened miscarriage, normal intrauterine pregnancy, UTI    Lab Tests:  I Ordered, and personally interpreted labs.  The pertinent results include: Mild anemia at 11.5,  UA with small leuks and rare bacteria   Imaging Studies ordered:  I ordered imaging studies including ultrasound OB less than 14 weeks I independently visualized and interpreted imaging which showed single viable intrauterine pregnancy, estimated gestational age [redacted] weeks and 1 day.  Small to moderate-sized subchorionic hemorrhage. I agree with the radiologist interpretation  Considered for admission or further workup however patient's vital signs, physical exam, labs, and imaging are reassuring.  Patient found to have single intrauterine pregnancy.  Patient to follow-up with OB/GYN for further evaluation workup.  Patient given return precautions.          Final Clinical Impression(s) / ED Diagnoses Final diagnoses:  Pregnancy, unspecified gestational age    Rx / DC Orders ED Discharge Orders     None         Carie Charity, PA-C 09/21/23 1825    Sallyanne Creamer, DO 09/23/23 1321

## 2023-09-21 NOTE — ED Triage Notes (Signed)
 Pt was seen here on 4/10 for vaginal bleeding while pregnant; she did not f/u with OB-GYN to have beta hcg levels checked; pt sts "I still feel pregnant"; sts she wants to make sure she is not having a miscarriage; denies bleeding or pain

## 2023-10-08 ENCOUNTER — Encounter

## 2023-12-30 ENCOUNTER — Ambulatory Visit
Admission: EM | Admit: 2023-12-30 | Discharge: 2023-12-30 | Disposition: A | Discharge status: Home / Self Care | Attending: Family Medicine | Admitting: Family Medicine

## 2023-12-30 DIAGNOSIS — N926 Irregular menstruation, unspecified: Secondary | ICD-10-CM | POA: Insufficient documentation

## 2023-12-30 DIAGNOSIS — N920 Excessive and frequent menstruation with regular cycle: Secondary | ICD-10-CM | POA: Insufficient documentation

## 2023-12-30 DIAGNOSIS — Z113 Encounter for screening for infections with a predominantly sexual mode of transmission: Secondary | ICD-10-CM | POA: Insufficient documentation

## 2023-12-30 LAB — POCT URINE PREGNANCY: Preg Test, Ur: NEGATIVE

## 2023-12-30 NOTE — ED Triage Notes (Addendum)
 Pt present with c/o spotting in between cycles. Reports she had an abortion in May and since then she has had intermittent light bleeding. Denies abdominal and back pain. Pt last was sexually active in June. States she has not contacted GYN for evaluation.  Pt is also requesting STD testing.

## 2023-12-30 NOTE — Discharge Instructions (Signed)
 Will contact you with results of the testing done today if positive.  Please follow-up with gynecology soon as possible for further evaluation of your symptoms.  Please go to the ER for any worsening symptoms.  Hope you feel better soon!

## 2023-12-30 NOTE — ED Provider Notes (Signed)
 [facility]    CSN: [id number] Arrival date & time: 12/30/23  0913      History   Chief Complaint Chief Complaint  Patient presents with   Vaginal Bleeding   SEXUALLY TRANSMITTED DISEASE    HPI Victoria Frye is a 23 y.o. female with a past medical history of GERD, anemia presents for spotting between periods.  Patient reports she had a chemical abortion in May.  Reports since then she has been having spotting between cycles as well as more than 1 period per month.  She denies any abdominal pain or cramping.  No dysuria.  She is not on any type of birth control and states she normally has regular cycles.  She would also like STD screening but denies any exposure, vaginal discharge.  [facility] follows with [facility] and has an appointment in October to see gynecology.  No other concerns at this time   Vaginal Bleeding   Past Medical History:  Diagnosis Date   Anemia    Anemia affecting pregnancy in third trimester 04/11/2018   Chlamydia trachomatis infection in mother during third trimester of pregnancy 06/07/2018   GERD (gastroesophageal reflux disease)    Miscarriage within last 12 months    Normal labor 06/27/2018    Patient Active Problem List   Diagnosis Date Noted   Supervision of other normal pregnancy, antepartum 06/28/2020   MDD (major depressive disorder), recurrent episode, severe (HCC) 10/16/2018    Past Surgical History:  Procedure Laterality Date   COLON SURGERY     Pt has colon widened when she was [age] months old   DILATION AND EVACUATION N/A 07/08/2017   Procedure: DILATATION AND EVACUATION;  Surgeon: Eveline Lynwood MATSU, MD;  Location: [facility];  Service: Gynecology;  Laterality: N/A;    OB History     Gravida  5   Para  1   Term  1   Preterm      AB  2   Living  1      SAB  1   IAB      Ectopic      Multiple  0   Live Births  1            Home Medications    Prior to Admission medications   Medication Sig Start  Date End Date Taking? Authorizing Provider  Blood Pressure Monitoring (BLOOD PRESSURE MONITOR AUTOMAT) DEVI 1 Device by Does not apply route daily. Automatic blood pressure cuff regular size. To monitor blood pressure regularly at home. ICD-10 code:Z34.90 06/28/20   Letha Renshaw, CNM  Misc. Devices (GOJJI WEIGHT SCALE) MISC 1 Device by Does not apply route daily as needed. To weight self daily as needed at home. ICD-10 code: Z34.90 06/28/20   Dawson, Rolitta, CNM  naproxen  (NAPROSYN ) 375 MG tablet Take 1 tablet (375 mg total) by mouth 2 (two) times daily with a meal. 09/04/22   Christopher Savannah, PA-C  ondansetron  (ZOFRAN -ODT) 4 MG disintegrating tablet Take 1 tablet (4 mg total) by mouth every 8 (eight) hours as needed for nausea or vomiting. 07/30/22   Griselda Norris, MD    Family History Family History  Problem Relation Age of Onset   Healthy Mother    Healthy Father     Social History Social History   Tobacco Use   Smoking status: Never   Smokeless tobacco: Never  Vaping Use   Vaping status: Never Used  Substance Use Topics   Alcohol use: No   Drug  use: Yes    Types: Marijuana     Allergies   Banana and Other   Review of Systems Review of Systems  Genitourinary:  Positive for vaginal bleeding.     Physical Exam Triage Vital Signs ED Triage Vitals  Encounter Vitals Group     BP 12/30/23 0947 99/64     Girls Systolic BP Percentile --      Girls Diastolic BP Percentile --      Boys Systolic BP Percentile --      Boys Diastolic BP Percentile --      Pulse Rate 12/30/23 0946 80     Resp 12/30/23 0946 18     Temp 12/30/23 0946 98.8 F (37.1 C)     Temp Source 12/30/23 0946 Oral     SpO2 12/30/23 0946 98 %     Weight --      Height --      Head Circumference --      Peak Flow --      Pain Score 12/30/23 0944 0     Pain Loc --      Pain Education --      Exclude from Growth Chart --    No data found.  Updated Vital Signs BP 99/64   Pulse 80   Temp 98.8 F (37.1  C) (Oral)   Resp 18   LMP 12/11/2023   SpO2 98%   Breastfeeding No   Visual Acuity Right Eye Distance:   Left Eye Distance:   Bilateral Distance:    Right Eye Near:   Left Eye Near:    Bilateral Near:     Physical Exam Vitals and nursing note reviewed.  Constitutional:      Appearance: Normal appearance.  HENT:     Head: Normocephalic and atraumatic.  Eyes:     Pupils: Pupils are equal, round, and reactive to light.  Cardiovascular:     Rate and Rhythm: Normal rate.  Pulmonary:     Effort: Pulmonary effort is normal.  Skin:    General: Skin is warm and dry.  Neurological:     General: No focal deficit present.     Mental Status: She is alert and oriented to person, place, and time.  Psychiatric:        Mood and Affect: Mood normal.        Behavior: Behavior normal.      UC Treatments / Results  Labs (all labs ordered are listed, but only abnormal results are displayed) Labs Reviewed  POCT URINE PREGNANCY  CERVICOVAGINAL ANCILLARY ONLY    EKG   Radiology No results found.  Procedures Procedures (including critical care time)  Medications Ordered in UC Medications - No data to display  Initial Impression / Assessment and Plan / UC Course  I have reviewed the triage vital signs and the nursing notes.  Pertinent labs & imaging results that were available during my care of the patient were reviewed by me and considered in my medical decision making (see chart for details).     Reviewed exam and symptoms with patient.  No red flags.  Urine hCG negative.  STD testing is ordered we will contact for any positive results.  Discussed could have spotting/irregular periods after abortions that may take a few months to return to normal.  Did advise that she still follow-up with gynecology for further workup to rule out any other underlying causes.  She was instructed to go to the ER for any worsening  symptoms that occur prior to her seeing gynecology, red flags  reviewed and patient verbalized understanding. Final Clinical Impressions(s) / UC Diagnoses   Final diagnoses:  Irregular periods  Screening examination for STD (sexually transmitted disease)  Spotting between menses     Discharge Instructions      Will contact you with results of the testing done today if positive.  Please follow-up with gynecology soon as possible for further evaluation of your symptoms.  Please go to the ER for any worsening symptoms.  Hope you feel better soon!    ED Prescriptions   None    PDMP not reviewed this encounter.   Loreda Myla SAUNDERS, NP 12/30/23 1013

## 2023-12-31 ENCOUNTER — Ambulatory Visit (HOSPITAL_COMMUNITY): Payer: Self-pay

## 2023-12-31 LAB — CERVICOVAGINAL ANCILLARY ONLY
Chlamydia: POSITIVE — AB
Comment: NEGATIVE
Comment: NEGATIVE
Comment: NORMAL
Neisseria Gonorrhea: NEGATIVE
Trichomonas: NEGATIVE

## 2023-12-31 MED ORDER — DOXYCYCLINE HYCLATE 100 MG PO TABS: 100.0000 mg | ORAL_TABLET | Freq: Two times a day (BID) | ORAL | 0 refills | Status: AC

## 2024-02-14 ENCOUNTER — Other Ambulatory Visit: Payer: Self-pay

## 2024-02-14 ENCOUNTER — Ambulatory Visit
Admission: EM | Admit: 2024-02-14 | Discharge: 2024-02-14 | Disposition: A | Discharge status: Home / Self Care | Attending: Physician Assistant | Admitting: Physician Assistant

## 2024-02-14 DIAGNOSIS — K047 Periapical abscess without sinus: Secondary | ICD-10-CM

## 2024-02-14 DIAGNOSIS — K0889 Other specified disorders of teeth and supporting structures: Secondary | ICD-10-CM | POA: Diagnosis not present

## 2024-02-14 MED ORDER — AMOXICILLIN-POT CLAVULANATE 400-57 MG/5ML PO SUSR
875.0000 mg | Freq: Two times a day (BID) | ORAL | 0 refills | Status: AC
Start: 2024-02-14 — End: 2024-02-24

## 2024-02-14 MED ORDER — LIDOCAINE VISCOUS HCL 2 % MT SOLN: 15.0000 mL | Freq: Four times a day (QID) | OROMUCOSAL | 0 refills | Status: AC | PRN

## 2024-02-14 MED ORDER — IBUPROFEN 100 MG/5ML PO SUSP: 600.0000 mg | Freq: Three times a day (TID) | ORAL | 0 refills | Status: AC | PRN

## 2024-02-14 NOTE — ED Triage Notes (Signed)
 Pt c/o mouth abscessx5d Pt states it has been getting progressively worse over the past 3 days. Pt states her left face is swollen, chills, pain in left temple, left cheek, left upper back and left side of chest. Pt states unable to eat due to the left facial pain started over night. PT states she has tried every over the counter medicine, but nothing has helped.

## 2024-02-14 NOTE — ED Provider Notes (Signed)
 [facility]    CSN: [id number] Arrival date & time: 02/14/24  0803      History   Chief Complaint No chief complaint on file.   HPI Victoria Frye is a 23 y.o. female.   Patient presents today with the 5-day history of left upper jaw pain.  She reports that over the past 24 hours this has become much more painful and is currently rated 10 on a 0-10 pain scale, described as throbbing with periodic sharp pains, worse with mastication, no alleviating factors identified.  She denies any recent dental procedure or extraction.  She does not have a dentist that she is following with currently.  Denies any fever, nausea, vomiting, swelling of her throat, shortness of breath.  She is having difficulty eating solid foods because of the pain.  She has been taking multiple over-the-counter medications including Tylenol , ibuprofen , Aleve  without improvement of symptoms.  She is confident that she is not pregnant.  Denies any recent antibiotics.  She is having difficulty with daily activities due to the pain.    Past Medical History:  Diagnosis Date   Anemia    Anemia affecting pregnancy in third trimester 04/11/2018   Chlamydia trachomatis infection in mother during third trimester of pregnancy 06/07/2018   GERD (gastroesophageal reflux disease)    Miscarriage within last 12 months    Normal labor 06/27/2018    Patient Active Problem List   Diagnosis Date Noted   Supervision of other normal pregnancy, antepartum 06/28/2020   MDD (major depressive disorder), recurrent episode, severe (HCC) 10/16/2018    Past Surgical History:  Procedure Laterality Date   COLON SURGERY     Pt has colon widened when she was [age] months old   DILATION AND EVACUATION N/A 07/08/2017   Procedure: DILATATION AND EVACUATION;  Surgeon: Eveline Lynwood MATSU, MD;  Location: [facility];  Service: Gynecology;  Laterality: N/A;    OB History     Gravida  5   Para  1   Term  1   Preterm      AB  2   Living   1      SAB  1   IAB      Ectopic      Multiple  0   Live Births  1            Home Medications    Prior to Admission medications   Medication Sig Start Date End Date Taking? Authorizing Provider  amoxicillin -clavulanate (AUGMENTIN ) 400-57 MG/5ML suspension Take 10.9 mLs (875 mg total) by mouth 2 (two) times daily for 10 days. 02/14/24 02/24/24 Yes Araf Clugston K, PA-C  ibuprofen  (ADVIL ) 100 MG/5ML suspension Take 30 mLs (600 mg total) by mouth every 8 (eight) hours as needed. 02/14/24  Yes Lauraann Missey K, PA-C  lidocaine  (XYLOCAINE ) 2 % solution Use as directed 15 mLs in the mouth or throat every 6 (six) hours as needed for mouth pain. Swish and spit 02/14/24  Yes Shahil Speegle K, PA-C  Blood Pressure Monitoring (BLOOD PRESSURE MONITOR AUTOMAT) DEVI 1 Device by Does not apply route daily. Automatic blood pressure cuff regular size. To monitor blood pressure regularly at home. ICD-10 code:Z34.90 06/28/20   Letha Renshaw, CNM  Misc. Devices (GOJJI WEIGHT SCALE) MISC 1 Device by Does not apply route daily as needed. To weight self daily as needed at home. ICD-10 code: Z34.90 06/28/20   Letha Renshaw, CNM    Family History Family History  Problem Relation Age  of Onset   Healthy Mother    Healthy Father     Social History Social History   Tobacco Use   Smoking status: Never   Smokeless tobacco: Never  Vaping Use   Vaping status: Never Used  Substance Use Topics   Alcohol use: No   Drug use: Not Currently    Types: Marijuana     Allergies   Banana and Other   Review of Systems Review of Systems  Constitutional:  Positive for activity change. Negative for appetite change, fatigue and fever.  HENT:  Positive for dental problem. Negative for congestion, sneezing, sore throat, trouble swallowing and voice change.   Respiratory:  Negative for cough and shortness of breath.   Cardiovascular:  Negative for chest pain.  Gastrointestinal:  Negative for abdominal pain,  diarrhea, nausea and vomiting.  Neurological:  Positive for headaches. Negative for dizziness and light-headedness.     Physical Exam Triage Vital Signs ED Triage Vitals  Encounter Vitals Group     BP 02/14/24 0901 112/80     Girls Systolic BP Percentile --      Girls Diastolic BP Percentile --      Boys Systolic BP Percentile --      Boys Diastolic BP Percentile --      Pulse Rate 02/14/24 0901 (!) 105     Resp 02/14/24 0901 18     Temp 02/14/24 0901 98.2 F (36.8 C)     Temp Source 02/14/24 0901 Oral     SpO2 02/14/24 0901 98 %     Weight --      Height --      Head Circumference --      Peak Flow --      Pain Score 02/14/24 0900 10     Pain Loc --      Pain Education --      Exclude from Growth Chart --    No data found.  Updated Vital Signs BP 112/80   Pulse 93   Temp 98.2 F (36.8 C) (Oral)   Resp 18   LMP 02/03/2024   SpO2 99%   Visual Acuity Right Eye Distance:   Left Eye Distance:   Bilateral Distance:    Right Eye Near:   Left Eye Near:    Bilateral Near:     Physical Exam Vitals reviewed.  Constitutional:      General: She is awake. She is not in acute distress.    Appearance: Normal appearance. She is well-developed. She is not ill-appearing.     Comments: Very pleasant female appears stated age in no acute distress sitting comfortably in exam room  HENT:     Head: Normocephalic and atraumatic.     Nose: Nose normal.     Mouth/Throat:     Dentition: Dental tenderness, gingival swelling and dental abscesses present.     Pharynx: Uvula midline. No oropharyngeal exudate or posterior oropharyngeal erythema.      Comments: No evidence of Ludwig angina Cardiovascular:     Rate and Rhythm: Normal rate and regular rhythm.     Heart sounds: Normal heart sounds, S1 normal and S2 normal. No murmur heard. Pulmonary:     Effort: Pulmonary effort is normal.     Breath sounds: Normal breath sounds. No wheezing, rhonchi or rales.     Comments: Clear to  auscultation Psychiatric:        Behavior: Behavior is cooperative.      UC Treatments / Results  Labs (all labs ordered are listed, but only abnormal results are displayed) Labs Reviewed - No data to display  EKG   Radiology No results found.  Procedures Procedures (including critical care time)  Medications Ordered in UC Medications - No data to display  Initial Impression / Assessment and Plan / UC Course  I have reviewed the triage vital signs and the nursing notes.  Pertinent labs & imaging results that were available during my care of the patient were reviewed by me and considered in my medical decision making (see chart for details).     Patient is well-appearing, afebrile, nontoxic, nontachycardic.  No indication for emergent evaluation or imaging.  Patient was treated for dental infection with Augmentin  twice daily for 10 days.  She was given ibuprofen  600 mg for pain relief with instruction to take this with food to prevent GI upset.  She is to avoid use of additional NSAIDs.  Can use Tylenol  for breakthrough pain.  Recommend she gargle with warm salt water  for additional symptom relief.  She was also given viscous lidocaine  to help with pain and we discussed that she should swish and spit this every 4-6 hours as needed but that she is to avoid eating and drinking immediately after using this medication due to increased risk of choking.  Discussed that ultimately she will need to see dentist to address underlying tooth.  She was provided low-cost dental resources in the area with after visit summary.  If she develops any worsening symptoms including fever, nausea, vomiting, swelling of his throat, muffled voice, dysphagia she needs to go to the emergency room to which she expressed understanding.  Work excuse note provided.   Final Clinical Impressions(s) / UC Diagnoses   Final diagnoses:  Dental abscess  Dentalgia     Discharge Instructions      Start Augmentin   twice daily for 10 days.  Use ibuprofen  600 mg up to 3 times a day for pain relief.  You should avoid NSAIDs including aspirin, ibuprofen /Advil , naproxen /Aleve  with this medication as it causes stomach bleeding.  You can use Tylenol  for breakthrough pain.  Gargle with warm salt water  for additional symptom relief.  Use viscous lidocaine  every 4-6 hours as needed.  Do not eat or drink immediately after using this as it increases the risk of choking.  You should follow-up with dentist; call to schedule an appointment.  If you develop any worsening symptoms including difficulty swallowing, difficulty speaking, swelling of your throat, high fever, change in your voice you need to be seen immediately.      ED Prescriptions     Medication Sig Dispense Auth. Provider   amoxicillin -clavulanate (AUGMENTIN ) 400-57 MG/5ML suspension Take 10.9 mLs (875 mg total) by mouth 2 (two) times daily for 10 days. 218 mL Nasier Thumm K, PA-C   ibuprofen  (ADVIL ) 100 MG/5ML suspension Take 30 mLs (600 mg total) by mouth every 8 (eight) hours as needed. 473 mL Jancarlos Thrun K, PA-C   lidocaine  (XYLOCAINE ) 2 % solution Use as directed 15 mLs in the mouth or throat every 6 (six) hours as needed for mouth pain. Swish and spit 100 mL Yossef Gilkison K, PA-C      PDMP not reviewed this encounter.   Sherrell Rocky POUR, PA-C 02/14/24 [id number]

## 2024-02-14 NOTE — Discharge Instructions (Addendum)
 Start Augmentin  twice daily for 10 days.  Use ibuprofen  600 mg up to 3 times a day for pain relief.  You should avoid NSAIDs including aspirin, ibuprofen /Advil , naproxen /Aleve  with this medication as it causes stomach bleeding.  You can use Tylenol  for breakthrough pain.  Gargle with warm salt water  for additional symptom relief.  Use viscous lidocaine  every 4-6 hours as needed.  Do not eat or drink immediately after using this as it increases the risk of choking.  You should follow-up with dentist; call to schedule an appointment.  If you develop any worsening symptoms including difficulty swallowing, difficulty speaking, swelling of your throat, high fever, change in your voice you need to be seen immediately.

## 2024-04-14 ENCOUNTER — Ambulatory Visit
Admission: EM | Admit: 2024-04-14 | Discharge: 2024-04-14 | Disposition: A | Discharge status: Home / Self Care | Attending: Urgent Care | Admitting: Urgent Care

## 2024-04-14 DIAGNOSIS — Z113 Encounter for screening for infections with a predominantly sexual mode of transmission: Secondary | ICD-10-CM | POA: Diagnosis not present

## 2024-04-14 NOTE — Discharge Instructions (Signed)
 Your test results should be available tomorrow.  Our results team will let you know if you need any kind of treatment for any positive test results.

## 2024-04-14 NOTE — ED Triage Notes (Signed)
 Pt requesting STD testing -states she was treated for chlamydia in August and was advised to return for retesting-denies sx-NAD-steady gait

## 2024-04-14 NOTE — ED Provider Notes (Signed)
 [facility]  Note:  This document was prepared using Conservation officer, historic buildings and may include unintentional dictation errors.  MRN: [id number] DOB: 18-Jul-2000  Subjective:   Victoria Frye is a 23 y.o. female presenting for STI screen. Denies fever, n/v, abdominal pain, pelvic pain, rashes, dysuria, urinary frequency, hematuria, vaginal discharge.  Declines HIV and syphilis testing.  Really just wants to make sure that she cleared her chlamydia infection.  No current facility-administered medications for this encounter.  Current Outpatient Medications:    Blood Pressure Monitoring (BLOOD PRESSURE MONITOR AUTOMAT) DEVI, 1 Device by Does not apply route daily. Automatic blood pressure cuff regular size. To monitor blood pressure regularly at home. ICD-10 code:Z34.90, Disp: 1 each, Rfl: 0   ibuprofen  (ADVIL ) 100 MG/5ML suspension, Take 30 mLs (600 mg total) by mouth every 8 (eight) hours as needed., Disp: 473 mL, Rfl: 0   lidocaine  (XYLOCAINE ) 2 % solution, Use as directed 15 mLs in the mouth or throat every 6 (six) hours as needed for mouth pain. Swish and spit, Disp: 100 mL, Rfl: 0   Misc. Devices (GOJJI WEIGHT SCALE) MISC, 1 Device by Does not apply route daily as needed. To weight self daily as needed at home. ICD-10 code: Z34.90, Disp: 1 each, Rfl: 0   Allergies  Allergen Reactions   Banana Anaphylaxis and Itching   Other Itching    Onions    Past Medical History:  Diagnosis Date   Anemia    Anemia affecting pregnancy in third trimester 04/11/2018   Chlamydia trachomatis infection in mother during third trimester of pregnancy 06/07/2018   GERD (gastroesophageal reflux disease)    Miscarriage within last 12 months    Normal labor 06/27/2018     Past Surgical History:  Procedure Laterality Date   COLON SURGERY     Pt has colon widened when she was [age] months old   DILATION AND EVACUATION N/A 07/08/2017   Procedure: DILATATION AND EVACUATION;   Surgeon: Eveline Lynwood MATSU, MD;  Location: [facility];  Service: Gynecology;  Laterality: N/A;    Family History  Problem Relation Age of Onset   Healthy Mother    Healthy Father     Social History   Tobacco Use   Smoking status: Never   Smokeless tobacco: Never  Vaping Use   Vaping status: Never Used  Substance Use Topics   Alcohol use: No   Drug use: Not Currently    Types: Marijuana    ROS   Objective:   Vitals: BP 104/73 (BP Location: Right Arm)   Pulse (!) 101   Temp 98.6 F (37 C) (Oral)   Resp 16   LMP 03/28/2024   SpO2 98%   Physical Exam Constitutional:      General: She is not in acute distress.    Appearance: Normal appearance. She is well-developed. She is not ill-appearing, toxic-appearing or diaphoretic.  HENT:     Head: Normocephalic and atraumatic.     Nose: Nose normal.     Mouth/Throat:     Mouth: Mucous membranes are moist.  Eyes:     General: No scleral icterus.       Right eye: No discharge.        Left eye: No discharge.     Extraocular Movements: Extraocular movements intact.  Cardiovascular:     Rate and Rhythm: Normal rate.  Pulmonary:     Effort: Pulmonary effort is normal.  Skin:    General: Skin is  warm and dry.  Neurological:     General: No focal deficit present.     Mental Status: She is alert and oriented to person, place, and time.  Psychiatric:        Mood and Affect: Mood normal.        Behavior: Behavior normal.       Assessment and Plan :   PDMP not reviewed this encounter.  1. Screen for STD (sexually transmitted disease)    Will treat as appropriate based off of lab results.    Christopher Savannah, [place] 04/14/24 [phone]

## 2024-04-15 LAB — CERVICOVAGINAL ANCILLARY ONLY
Chlamydia: NEGATIVE
Comment: NEGATIVE
Comment: NEGATIVE
Comment: NORMAL
Neisseria Gonorrhea: NEGATIVE
Trichomonas: NEGATIVE

## 2024-04-17 LAB — MISC LABCORP TEST (SEND OUT): Labcorp test code: 180076

## 2024-06-17 ENCOUNTER — Emergency Department (HOSPITAL_COMMUNITY)
Admission: EM | Admit: 2024-06-17 | Discharge: 2024-06-17 | Disposition: A | Discharge status: Home / Self Care | Attending: Emergency Medicine | Admitting: Emergency Medicine

## 2024-06-17 ENCOUNTER — Other Ambulatory Visit: Payer: Self-pay

## 2024-06-17 DIAGNOSIS — J101 Influenza due to other identified influenza virus with other respiratory manifestations: Secondary | ICD-10-CM | POA: Insufficient documentation

## 2024-06-17 DIAGNOSIS — R059 Cough, unspecified: Secondary | ICD-10-CM | POA: Diagnosis present

## 2024-06-17 LAB — RESP PANEL BY RT-PCR (RSV, FLU A&B, COVID)  RVPGX2
Influenza A by PCR: POSITIVE — AB
Influenza B by PCR: NEGATIVE
Resp Syncytial Virus by PCR: NEGATIVE
SARS Coronavirus 2 by RT PCR: NEGATIVE

## 2024-06-17 MED ORDER — ACETAMINOPHEN 500 MG PO TABS
1000.0000 mg | ORAL_TABLET | Freq: Once | ORAL | Status: AC
  Administered 2024-06-17: 1000 mg via ORAL
  Filled 2024-06-17: qty 2

## 2024-06-17 NOTE — ED Triage Notes (Addendum)
 Patient arrives via [facility] for flu like symptoms with cough, nausea/vomiting, and back pain with rib cage pain x five days. Patient has been around her children who have been sick. Patient with 101.7 fever given tylenol  prior to ED arrival. Patient alert oriented ambulatory. Patient endorses ribs only hurt when coughing. Patient states cough is productive.  BP 110/70 HR 100 RR 18 97 on room air Temp 101.7

## 2024-06-17 NOTE — ED Provider Notes (Signed)
 " [place] EMERGENCY DEPARTMENT AT [facility] Provider Note   CSN: [id number] Arrival date & time: 06/17/24  1547     Patient presents with: Back Pain, Cough, and Nausea   Victoria Frye is a 24 y.o. female here presenting with flulike syndrome.  Patient has been having cough for the last 5 days.  Also has fever 101 at home.  Patient also has kids that are sick with similar symptoms and they were diagnosed with flu.  No history of asthma   The history is provided by the patient.       Prior to Admission medications  Medication Sig Start Date End Date Taking? Authorizing Provider  Blood Pressure Monitoring (BLOOD PRESSURE MONITOR AUTOMAT) DEVI 1 Device by Does not apply route daily. Automatic blood pressure cuff regular size. To monitor blood pressure regularly at home. ICD-10 code:Z34.90 06/28/20   Dawson, Rolitta, CNM  ibuprofen  (ADVIL ) 100 MG/5ML suspension Take 30 mLs (600 mg total) by mouth every 8 (eight) hours as needed. 02/14/24   Raspet, Erin K, PA-C  lidocaine  (XYLOCAINE ) 2 % solution Use as directed 15 mLs in the mouth or throat every 6 (six) hours as needed for mouth pain. Swish and spit 02/14/24   Raspet, Rocky POUR, PA-C  Misc. Devices (GOJJI WEIGHT SCALE) MISC 1 Device by Does not apply route daily as needed. To weight self daily as needed at home. ICD-10 code: Z34.90 06/28/20   Dawson, Rolitta, CNM    Allergies: Banana and Other    Review of Systems  Respiratory:  Positive for cough.   Musculoskeletal:  Positive for back pain.  All other systems reviewed and are negative.   Updated Vital Signs BP 99/66   Pulse (!) 108   Temp 99.2 F (37.3 C) (Oral)   Resp 18   Ht 5' (1.524 m)   Wt 49.9 kg   SpO2 94%   BMI 21.48 kg/m   Physical Exam Vitals and nursing note reviewed.  HENT:     Head: Normocephalic.     Nose: Nose normal.     Mouth/Throat:     Mouth: Mucous membranes are moist.  Eyes:     Pupils: Pupils are equal, round, and reactive to light.   Cardiovascular:     Rate and Rhythm: Normal rate.     Pulses: Normal pulses.  Pulmonary:     Effort: Pulmonary effort is normal.     Breath sounds: Normal breath sounds.  Abdominal:     General: Abdomen is flat.     Palpations: Abdomen is soft.  Musculoskeletal:        General: Normal range of motion.     Cervical back: Normal range of motion.  Skin:    General: Skin is warm.     Capillary Refill: Capillary refill takes less than 2 seconds.  Neurological:     General: No focal deficit present.     Mental Status: She is alert.  Psychiatric:        Mood and Affect: Mood normal.        Thought Content: Thought content normal.     (all labs ordered are listed, but only abnormal results are displayed) Labs Reviewed  RESP PANEL BY RT-PCR (RSV, FLU A&B, COVID)  [id number] - Abnormal; Notable for the following components:      Result Value   Influenza A by PCR POSITIVE (*)    All other components within normal limits    EKG: None  Radiology: No results  found.   Procedures   Medications Ordered in the ED  acetaminophen  (TYLENOL ) tablet 1,000 mg (1,000 mg Oral Given 06/17/24 1730)                                    Medical Decision Making Zionah Criswell is a 24 y.o. female who presenting with flulike syndrome.  Positive for flu A.  Patient is well-appearing and not hypoxic.  Patient is outside of window for Tamiflu.  Stable for discharge   Risk OTC drugs.     Final diagnoses:  None    ED Discharge Orders     None          Patt Alm Macho, MD 06/17/24 1752  "

## 2024-06-17 NOTE — ED Notes (Signed)
 Patient discharged by RN. Patient verbalizes understanding of instructions. In wheelchair to lobby with family.

## 2024-06-17 NOTE — Discharge Instructions (Signed)
 You have influenza A  Take Tylenol  Motrin  for fever  Please rest for several days  See your doctor for follow-up  Return to ER if you have worse cough or trouble breathing
